# Patient Record
Sex: Female | Born: 1950 | Race: White | Hispanic: No | Marital: Married | Smoking: Never smoker
Health system: Southern US, Community
[De-identification: ages and names within clinical notes are randomized; demographics above are authoritative.]

## PROBLEM LIST (undated history)

## (undated) DIAGNOSIS — G4700 Insomnia, unspecified: Secondary | ICD-10-CM

## (undated) DIAGNOSIS — R002 Palpitations: Secondary | ICD-10-CM

## (undated) DIAGNOSIS — J45909 Unspecified asthma, uncomplicated: Secondary | ICD-10-CM

## (undated) DIAGNOSIS — M1990 Unspecified osteoarthritis, unspecified site: Secondary | ICD-10-CM

## (undated) DIAGNOSIS — I83893 Varicose veins of bilateral lower extremities with other complications: Secondary | ICD-10-CM

## (undated) DIAGNOSIS — E039 Hypothyroidism, unspecified: Secondary | ICD-10-CM

## (undated) HISTORY — DX: Insomnia, unspecified: G47.00

## (undated) HISTORY — DX: Varicose veins of bilateral lower extremities with other complications: I83.893

## (undated) HISTORY — PX: KNEE SURGERY: SHX244

## (undated) HISTORY — DX: Palpitations: R00.2

## (undated) HISTORY — PX: BREAST BIOPSY: SHX20

## (undated) HISTORY — DX: Hypothyroidism, unspecified: E03.9

## (undated) HISTORY — DX: Unspecified asthma, uncomplicated: J45.909

---

## 1995-05-05 DIAGNOSIS — E039 Hypothyroidism, unspecified: Secondary | ICD-10-CM

## 1995-05-05 HISTORY — DX: Hypothyroidism, unspecified: E03.9

## 2013-08-15 DIAGNOSIS — E039 Hypothyroidism, unspecified: Secondary | ICD-10-CM | POA: Diagnosis not present

## 2013-08-15 DIAGNOSIS — M7660 Achilles tendinitis, unspecified leg: Secondary | ICD-10-CM | POA: Diagnosis not present

## 2013-08-15 DIAGNOSIS — G4700 Insomnia, unspecified: Secondary | ICD-10-CM | POA: Diagnosis not present

## 2013-10-03 DIAGNOSIS — Z5181 Encounter for therapeutic drug level monitoring: Secondary | ICD-10-CM | POA: Diagnosis not present

## 2013-10-03 DIAGNOSIS — R030 Elevated blood-pressure reading, without diagnosis of hypertension: Secondary | ICD-10-CM | POA: Diagnosis not present

## 2013-10-03 DIAGNOSIS — E669 Obesity, unspecified: Secondary | ICD-10-CM | POA: Diagnosis not present

## 2013-10-03 DIAGNOSIS — Z0000 Encounter for general adult medical examination without abnormal findings: Secondary | ICD-10-CM | POA: Diagnosis not present

## 2013-10-03 DIAGNOSIS — Z136 Encounter for screening for cardiovascular disorders: Secondary | ICD-10-CM | POA: Diagnosis not present

## 2013-11-16 DIAGNOSIS — Z780 Asymptomatic menopausal state: Secondary | ICD-10-CM | POA: Diagnosis not present

## 2014-03-13 DIAGNOSIS — Z23 Encounter for immunization: Secondary | ICD-10-CM | POA: Diagnosis not present

## 2014-04-03 DIAGNOSIS — H538 Other visual disturbances: Secondary | ICD-10-CM | POA: Diagnosis not present

## 2014-04-03 DIAGNOSIS — H52223 Regular astigmatism, bilateral: Secondary | ICD-10-CM | POA: Diagnosis not present

## 2014-04-03 DIAGNOSIS — H2511 Age-related nuclear cataract, right eye: Secondary | ICD-10-CM | POA: Diagnosis not present

## 2014-04-03 DIAGNOSIS — H029 Unspecified disorder of eyelid: Secondary | ICD-10-CM | POA: Diagnosis not present

## 2014-04-04 ENCOUNTER — Other Ambulatory Visit: Payer: Self-pay

## 2014-04-04 DIAGNOSIS — E039 Hypothyroidism, unspecified: Secondary | ICD-10-CM | POA: Diagnosis not present

## 2014-04-04 DIAGNOSIS — Z1231 Encounter for screening mammogram for malignant neoplasm of breast: Secondary | ICD-10-CM

## 2014-04-12 DIAGNOSIS — H029 Unspecified disorder of eyelid: Secondary | ICD-10-CM | POA: Diagnosis not present

## 2014-04-12 DIAGNOSIS — H52223 Regular astigmatism, bilateral: Secondary | ICD-10-CM | POA: Diagnosis not present

## 2014-04-18 ENCOUNTER — Ambulatory Visit: Admission: RE | Admit: 2014-04-18 | Payer: Medicare Other | Source: Ambulatory Visit

## 2014-04-18 ENCOUNTER — Other Ambulatory Visit: Payer: Self-pay

## 2014-04-18 ENCOUNTER — Ambulatory Visit
Admission: RE | Admit: 2014-04-18 | Discharge: 2014-04-18 | Disposition: A | Discharge status: Home / Self Care | Payer: Medicare Other | Source: Ambulatory Visit

## 2014-04-18 DIAGNOSIS — Z1231 Encounter for screening mammogram for malignant neoplasm of breast: Secondary | ICD-10-CM | POA: Diagnosis not present

## 2014-05-15 DIAGNOSIS — E039 Hypothyroidism, unspecified: Secondary | ICD-10-CM | POA: Diagnosis not present

## 2014-07-10 DIAGNOSIS — R05 Cough: Secondary | ICD-10-CM | POA: Diagnosis not present

## 2014-10-08 DIAGNOSIS — E039 Hypothyroidism, unspecified: Secondary | ICD-10-CM | POA: Diagnosis not present

## 2014-10-08 DIAGNOSIS — Z0000 Encounter for general adult medical examination without abnormal findings: Secondary | ICD-10-CM | POA: Diagnosis not present

## 2014-10-08 DIAGNOSIS — E785 Hyperlipidemia, unspecified: Secondary | ICD-10-CM | POA: Diagnosis not present

## 2014-10-08 DIAGNOSIS — Z1211 Encounter for screening for malignant neoplasm of colon: Secondary | ICD-10-CM | POA: Diagnosis not present

## 2014-10-09 DIAGNOSIS — E039 Hypothyroidism, unspecified: Secondary | ICD-10-CM | POA: Diagnosis not present

## 2014-10-09 DIAGNOSIS — E785 Hyperlipidemia, unspecified: Secondary | ICD-10-CM | POA: Diagnosis not present

## 2014-10-16 DIAGNOSIS — Z1211 Encounter for screening for malignant neoplasm of colon: Secondary | ICD-10-CM | POA: Diagnosis not present

## 2014-12-18 DIAGNOSIS — R05 Cough: Secondary | ICD-10-CM | POA: Diagnosis not present

## 2015-01-11 DIAGNOSIS — M7051 Other bursitis of knee, right knee: Secondary | ICD-10-CM | POA: Diagnosis not present

## 2015-01-11 DIAGNOSIS — M25571 Pain in right ankle and joints of right foot: Secondary | ICD-10-CM | POA: Diagnosis not present

## 2015-01-11 DIAGNOSIS — M7052 Other bursitis of knee, left knee: Secondary | ICD-10-CM | POA: Diagnosis not present

## 2015-01-11 DIAGNOSIS — M25562 Pain in left knee: Secondary | ICD-10-CM | POA: Diagnosis not present

## 2015-01-11 DIAGNOSIS — M25561 Pain in right knee: Secondary | ICD-10-CM | POA: Diagnosis not present

## 2015-02-26 DIAGNOSIS — Z23 Encounter for immunization: Secondary | ICD-10-CM | POA: Diagnosis not present

## 2015-03-30 DIAGNOSIS — S93504A Unspecified sprain of right lesser toe(s), initial encounter: Secondary | ICD-10-CM | POA: Diagnosis not present

## 2015-04-18 DIAGNOSIS — B001 Herpesviral vesicular dermatitis: Secondary | ICD-10-CM | POA: Diagnosis not present

## 2015-05-01 ENCOUNTER — Other Ambulatory Visit: Payer: Self-pay

## 2015-05-01 DIAGNOSIS — Z1231 Encounter for screening mammogram for malignant neoplasm of breast: Secondary | ICD-10-CM

## 2015-05-07 ENCOUNTER — Ambulatory Visit: Payer: Self-pay

## 2015-05-10 DIAGNOSIS — J45901 Unspecified asthma with (acute) exacerbation: Secondary | ICD-10-CM | POA: Diagnosis not present

## 2015-05-21 DIAGNOSIS — M7051 Other bursitis of knee, right knee: Secondary | ICD-10-CM | POA: Diagnosis not present

## 2015-05-21 DIAGNOSIS — M7052 Other bursitis of knee, left knee: Secondary | ICD-10-CM | POA: Diagnosis not present

## 2015-05-27 DIAGNOSIS — M25562 Pain in left knee: Secondary | ICD-10-CM | POA: Diagnosis not present

## 2015-05-27 DIAGNOSIS — M25561 Pain in right knee: Secondary | ICD-10-CM | POA: Diagnosis not present

## 2015-05-30 DIAGNOSIS — M25562 Pain in left knee: Secondary | ICD-10-CM | POA: Diagnosis not present

## 2015-05-30 DIAGNOSIS — M25561 Pain in right knee: Secondary | ICD-10-CM | POA: Diagnosis not present

## 2015-06-04 DIAGNOSIS — M25562 Pain in left knee: Secondary | ICD-10-CM | POA: Diagnosis not present

## 2015-06-04 DIAGNOSIS — M25561 Pain in right knee: Secondary | ICD-10-CM | POA: Diagnosis not present

## 2015-06-06 DIAGNOSIS — M25561 Pain in right knee: Secondary | ICD-10-CM | POA: Diagnosis not present

## 2015-06-06 DIAGNOSIS — M25562 Pain in left knee: Secondary | ICD-10-CM | POA: Diagnosis not present

## 2015-06-11 DIAGNOSIS — M25562 Pain in left knee: Secondary | ICD-10-CM | POA: Diagnosis not present

## 2015-06-11 DIAGNOSIS — M25561 Pain in right knee: Secondary | ICD-10-CM | POA: Diagnosis not present

## 2015-06-13 DIAGNOSIS — M25562 Pain in left knee: Secondary | ICD-10-CM | POA: Diagnosis not present

## 2015-06-13 DIAGNOSIS — M25561 Pain in right knee: Secondary | ICD-10-CM | POA: Diagnosis not present

## 2015-06-18 DIAGNOSIS — M25562 Pain in left knee: Secondary | ICD-10-CM | POA: Diagnosis not present

## 2015-06-18 DIAGNOSIS — M25561 Pain in right knee: Secondary | ICD-10-CM | POA: Diagnosis not present

## 2015-06-19 DIAGNOSIS — R591 Generalized enlarged lymph nodes: Secondary | ICD-10-CM | POA: Diagnosis not present

## 2015-06-20 ENCOUNTER — Other Ambulatory Visit: Payer: Self-pay | Admitting: Family Medicine

## 2015-06-20 DIAGNOSIS — M25561 Pain in right knee: Secondary | ICD-10-CM | POA: Diagnosis not present

## 2015-06-20 DIAGNOSIS — R591 Generalized enlarged lymph nodes: Secondary | ICD-10-CM

## 2015-06-20 DIAGNOSIS — M25562 Pain in left knee: Secondary | ICD-10-CM | POA: Diagnosis not present

## 2015-06-24 ENCOUNTER — Other Ambulatory Visit: Payer: Self-pay

## 2015-06-25 DIAGNOSIS — M25561 Pain in right knee: Secondary | ICD-10-CM | POA: Diagnosis not present

## 2015-06-25 DIAGNOSIS — M25562 Pain in left knee: Secondary | ICD-10-CM | POA: Diagnosis not present

## 2015-06-26 ENCOUNTER — Ambulatory Visit
Admission: RE | Admit: 2015-06-26 | Discharge: 2015-06-26 | Disposition: A | Discharge status: Home / Self Care | Payer: Medicare Other | Source: Ambulatory Visit | Attending: Family Medicine | Admitting: Family Medicine

## 2015-06-26 DIAGNOSIS — R591 Generalized enlarged lymph nodes: Secondary | ICD-10-CM

## 2015-06-26 DIAGNOSIS — R221 Localized swelling, mass and lump, neck: Secondary | ICD-10-CM | POA: Diagnosis not present

## 2015-06-27 DIAGNOSIS — M25561 Pain in right knee: Secondary | ICD-10-CM | POA: Diagnosis not present

## 2015-06-27 DIAGNOSIS — M25562 Pain in left knee: Secondary | ICD-10-CM | POA: Diagnosis not present

## 2015-07-02 DIAGNOSIS — M25561 Pain in right knee: Secondary | ICD-10-CM | POA: Diagnosis not present

## 2015-07-02 DIAGNOSIS — M545 Low back pain: Secondary | ICD-10-CM | POA: Diagnosis not present

## 2015-07-08 DIAGNOSIS — M25562 Pain in left knee: Secondary | ICD-10-CM | POA: Diagnosis not present

## 2015-07-08 DIAGNOSIS — M25561 Pain in right knee: Secondary | ICD-10-CM | POA: Diagnosis not present

## 2015-07-11 DIAGNOSIS — M25562 Pain in left knee: Secondary | ICD-10-CM | POA: Diagnosis not present

## 2015-07-11 DIAGNOSIS — M25561 Pain in right knee: Secondary | ICD-10-CM | POA: Diagnosis not present

## 2015-07-15 DIAGNOSIS — M25562 Pain in left knee: Secondary | ICD-10-CM | POA: Diagnosis not present

## 2015-07-15 DIAGNOSIS — M25561 Pain in right knee: Secondary | ICD-10-CM | POA: Diagnosis not present

## 2015-07-18 DIAGNOSIS — M25561 Pain in right knee: Secondary | ICD-10-CM | POA: Diagnosis not present

## 2015-07-18 DIAGNOSIS — M25562 Pain in left knee: Secondary | ICD-10-CM | POA: Diagnosis not present

## 2015-08-13 DIAGNOSIS — M79671 Pain in right foot: Secondary | ICD-10-CM | POA: Diagnosis not present

## 2015-09-09 DIAGNOSIS — R05 Cough: Secondary | ICD-10-CM | POA: Diagnosis not present

## 2015-10-11 DIAGNOSIS — R05 Cough: Secondary | ICD-10-CM | POA: Diagnosis not present

## 2015-10-11 DIAGNOSIS — R591 Generalized enlarged lymph nodes: Secondary | ICD-10-CM | POA: Diagnosis not present

## 2015-10-11 DIAGNOSIS — E785 Hyperlipidemia, unspecified: Secondary | ICD-10-CM | POA: Diagnosis not present

## 2015-10-11 DIAGNOSIS — Z0000 Encounter for general adult medical examination without abnormal findings: Secondary | ICD-10-CM | POA: Diagnosis not present

## 2015-10-11 DIAGNOSIS — E039 Hypothyroidism, unspecified: Secondary | ICD-10-CM | POA: Diagnosis not present

## 2015-10-11 DIAGNOSIS — K219 Gastro-esophageal reflux disease without esophagitis: Secondary | ICD-10-CM | POA: Diagnosis not present

## 2015-10-11 DIAGNOSIS — Z1211 Encounter for screening for malignant neoplasm of colon: Secondary | ICD-10-CM | POA: Diagnosis not present

## 2015-10-14 ENCOUNTER — Ambulatory Visit
Admission: RE | Admit: 2015-10-14 | Discharge: 2015-10-14 | Disposition: A | Discharge status: Home / Self Care | Payer: Medicare Other | Source: Ambulatory Visit | Attending: Family Medicine | Admitting: Family Medicine

## 2015-10-14 ENCOUNTER — Other Ambulatory Visit: Payer: Self-pay | Admitting: Family Medicine

## 2015-10-14 DIAGNOSIS — R05 Cough: Secondary | ICD-10-CM | POA: Diagnosis not present

## 2015-10-14 DIAGNOSIS — R059 Cough, unspecified: Secondary | ICD-10-CM

## 2015-11-25 DIAGNOSIS — Z1211 Encounter for screening for malignant neoplasm of colon: Secondary | ICD-10-CM | POA: Diagnosis not present

## 2015-12-13 DIAGNOSIS — E039 Hypothyroidism, unspecified: Secondary | ICD-10-CM | POA: Diagnosis not present

## 2016-01-24 ENCOUNTER — Encounter: Payer: Self-pay | Admitting: Internal Medicine

## 2016-01-24 DIAGNOSIS — E039 Hypothyroidism, unspecified: Secondary | ICD-10-CM | POA: Diagnosis not present

## 2016-02-05 ENCOUNTER — Encounter (HOSPITAL_COMMUNITY): Payer: Self-pay | Admitting: *Deleted

## 2016-02-05 ENCOUNTER — Emergency Department (HOSPITAL_COMMUNITY): Payer: Medicare Other

## 2016-02-05 ENCOUNTER — Emergency Department (HOSPITAL_COMMUNITY)
Admission: EM | Admit: 2016-02-05 | Discharge: 2016-02-05 | Disposition: A | Discharge status: Home / Self Care | Payer: Medicare Other | Attending: Emergency Medicine | Admitting: Emergency Medicine

## 2016-02-05 DIAGNOSIS — R002 Palpitations: Secondary | ICD-10-CM | POA: Insufficient documentation

## 2016-02-05 HISTORY — DX: Unspecified osteoarthritis, unspecified site: M19.90

## 2016-02-05 LAB — CBC
HEMATOCRIT: 42 % (ref 36.0–46.0)
Hemoglobin: 13.4 g/dL (ref 12.0–15.0)
MCH: 28.7 pg (ref 26.0–34.0)
MCHC: 31.9 g/dL (ref 30.0–36.0)
MCV: 89.9 fL (ref 78.0–100.0)
PLATELETS: 256 10*3/uL (ref 150–400)
RBC: 4.67 MIL/uL (ref 3.87–5.11)
RDW: 14.6 % (ref 11.5–15.5)
WBC: 10.2 10*3/uL (ref 4.0–10.5)

## 2016-02-05 LAB — BASIC METABOLIC PANEL
ANION GAP: 10 (ref 5–15)
BUN: 12 mg/dL (ref 6–20)
CO2: 22 mmol/L (ref 22–32)
Calcium: 9.9 mg/dL (ref 8.9–10.3)
Chloride: 106 mmol/L (ref 101–111)
Creatinine, Ser: 0.87 mg/dL (ref 0.44–1.00)
GLUCOSE: 96 mg/dL (ref 65–99)
Potassium: 4.1 mmol/L (ref 3.5–5.1)
SODIUM: 138 mmol/L (ref 135–145)

## 2016-02-05 LAB — I-STAT TROPONIN, ED: Troponin i, poc: 0 ng/mL (ref 0.00–0.08)

## 2016-02-05 NOTE — ED Provider Notes (Signed)
[facility] DEPT Provider Note   CSN: [id number] Arrival date & time: 02/05/16  1737     History   Chief Complaint Chief Complaint  Patient presents with  . Palpitations    HPI Kendra Russell is a 65 y.o. female.  The history is provided by the patient. No language interpreter was used.  Palpitations      Kendra Russell is a 65 y.o. female who presents to the Emergency Department complaining of palpitations.  At 720 this morning she awoke with a quivering feeling in her left upper chest. Her symptoms lasted for less than a minute. She describes some associated soreness in the area. 2 months ago she had her Synthroid dose increased and since that time she's had episodes of intermittent diaphoresis and flushing. She did have some diaphoresis earlier today and she is unsure if it is related to the palpitations were due to her ongoing intermittent diaphoresis. She denies any associated fever, shortness of breath, chest pain, leg swelling or pain. She has a history of asthma and does get congestion and cough in her chest. This appears to be at its baseline. She has not had any recurrent palpitations, dizziness throughout the course of the day.  Past Medical History:  Diagnosis Date  . Arthritis   . Thyroid disease     There are no active problems to display for this patient.   Past Surgical History:  Procedure Laterality Date  . KNEE SURGERY      OB History    No data available       Home Medications    Prior to Admission medications   Not on File    Family History History reviewed. No pertinent family history.  Social History Social History  Substance Use Topics  . Smoking status: Never Smoker  . Smokeless tobacco: Never Used  . Alcohol use No     Allergies   Penicillins and Sulfa antibiotics   Review of Systems Review of Systems  Cardiovascular: Positive for palpitations.  All other systems reviewed and are negative.    Physical  Exam Updated Vital Signs BP 188/86   Pulse 91   Temp 98.3 F (36.8 C) (Oral)   Resp 18   Ht 5\' 5"  (1.651 m)   Wt 200 lb (90.7 kg)   SpO2 100%   BMI 33.28 kg/m   Physical Exam  Constitutional: She is oriented to person, place, and time. She appears well-developed and well-nourished.  HENT:  Head: Normocephalic and atraumatic.  Cardiovascular: Normal rate and regular rhythm.   No murmur heard. Pulmonary/Chest: Effort normal and breath sounds normal. No respiratory distress.  Abdominal: Soft. There is no tenderness. There is no rebound and no guarding.  Musculoskeletal: She exhibits no edema or tenderness.  Neurological: She is alert and oriented to person, place, and time.  Skin: Skin is warm and dry.  Psychiatric: She has a normal mood and affect. Her behavior is normal.  Nursing note and vitals reviewed.    ED Treatments / Results  Labs (all labs ordered are listed, but only abnormal results are displayed) Labs Reviewed  [place], ED    EKG  EKG Interpretation  Date/Time:  Wednesday February 05 2016 17:52:36 EDT Ventricular Rate:  90 PR Interval:  144 QRS Duration: 78 QT Interval:  348 QTC Calculation: 425 R Axis:   54 Text Interpretation:  Normal sinus rhythm Normal ECG Confirmed by Hazle Coca [phone]) on 02/05/2016 8:41:48 PM  Radiology Dg Chest 2 View  Result Date: 02/05/2016 CLINICAL DATA:  Heart palpitations starting this morning. EXAM: CHEST  2 VIEW COMPARISON:  10/14/2015 FINDINGS: The heart size there is top-normal and the mediastinal contours are within normal limits. No pneumonic consolidation, effusion or pneumothorax. Mild hyperinflation the upper lobes as before. No acute osseous abnormality. Small osteophytes are seen along the dorsal spine. IMPRESSION: No active cardiopulmonary disease. Mild hyperinflation of the lungs, upper lobe predominant. Electronically Signed   By: Ashley Royalty M.D.   On: 02/05/2016 21:12     Procedures Procedures (including critical care time)  Medications Ordered in ED Medications - No data to display   Initial Impression / Assessment and Plan / ED Course  I have reviewed the triage vital signs and the nursing notes.  Pertinent labs & imaging results that were available during my care of the patient were reviewed by me and considered in my medical decision making (see chart for details).  Clinical Course    Patient here for evaluation of palpitations. Her symptoms have resolved since 720 this morning. Presentation is not consistent with ACS, pneumonia, CHF. She is noted to be hypertensive in the emergency department but asymptomatic at this time. She has no known diagnosis of hypertension. Discussed with patient recording her blood pressures at home with PCP follow-up. Recommend cardiology follow-up for Holter monitoring for her palpitations. Discussed decreasing her caffeine use. Home care and return precautions discussed.  Final Clinical Impressions(s) / ED Diagnoses   Final diagnoses:  Palpitations    New Prescriptions New Prescriptions   No medications on file     Quintella Reichert, MD 02/05/16 2132

## 2016-02-05 NOTE — ED Triage Notes (Signed)
Pt reports "shaking" in her chest around 0720 this morning. Pt called dr around 1300, was told to come in for further eval. Pt denies having any other episodes since this morning. Pt denies shortness of breath, cp, n/v, denies lightheaded, dizziness.

## 2016-02-05 NOTE — Discharge Instructions (Signed)
Your blood pressure was elevated in the Emergency Department today.  Please check your blood pressure at home daily and record the number for your family doctor.

## 2016-02-06 ENCOUNTER — Other Ambulatory Visit (HOSPITAL_COMMUNITY): Payer: Self-pay | Admitting: *Deleted

## 2016-02-06 DIAGNOSIS — R002 Palpitations: Secondary | ICD-10-CM

## 2016-02-10 DIAGNOSIS — J4530 Mild persistent asthma, uncomplicated: Secondary | ICD-10-CM | POA: Diagnosis not present

## 2016-02-10 DIAGNOSIS — F5101 Primary insomnia: Secondary | ICD-10-CM | POA: Diagnosis not present

## 2016-02-10 DIAGNOSIS — R002 Palpitations: Secondary | ICD-10-CM | POA: Diagnosis not present

## 2016-02-19 ENCOUNTER — Ambulatory Visit (INDEPENDENT_AMBULATORY_CARE_PROVIDER_SITE_OTHER): Payer: Medicare Other

## 2016-02-19 DIAGNOSIS — R002 Palpitations: Secondary | ICD-10-CM

## 2016-03-11 ENCOUNTER — Institutional Professional Consult (permissible substitution): Payer: Self-pay | Admitting: Internal Medicine

## 2016-03-13 ENCOUNTER — Institutional Professional Consult (permissible substitution): Payer: Self-pay | Admitting: Emergency Medicine

## 2016-03-16 DIAGNOSIS — Z23 Encounter for immunization: Secondary | ICD-10-CM | POA: Diagnosis not present

## 2016-03-25 ENCOUNTER — Ambulatory Visit (INDEPENDENT_AMBULATORY_CARE_PROVIDER_SITE_OTHER): Payer: Medicare Other | Admitting: Internal Medicine

## 2016-03-25 ENCOUNTER — Encounter: Payer: Self-pay | Admitting: Internal Medicine

## 2016-03-25 VITALS — BP 124/74 | HR 89 | Ht 65.5 in | Wt 199.4 lb

## 2016-03-25 DIAGNOSIS — R079 Chest pain, unspecified: Secondary | ICD-10-CM | POA: Diagnosis not present

## 2016-03-25 DIAGNOSIS — R002 Palpitations: Secondary | ICD-10-CM

## 2016-03-25 DIAGNOSIS — R0602 Shortness of breath: Secondary | ICD-10-CM | POA: Diagnosis not present

## 2016-03-25 NOTE — Patient Instructions (Addendum)
Medication Instructions:  Your physician recommends that you continue on your current medications as directed. Please refer to the Current Medication list given to you today.   Labwork: None ordered   Testing/Procedures: Your physician has requested that you have an echocardiogram. Echocardiography is a painless test that uses sound waves to create images of your heart. It provides your doctor with information about the size and shape of your heart and how well your heart's chambers and valves are working. This procedure takes approximately one hour. There are no restrictions for this procedure.  Your physician has requested that you have a lexiscan myoview. For further information please visit [email or web address]. Please follow instruction sheet, as given.    Follow-Up:  Your physician recommends that you schedule a follow-up appointment in: 4 weeks with Dr Rayann Heman   Any Other Special Instructions Will Be Listed Below (If Applicable).     AliveCor Monitor

## 2016-03-25 NOTE — Progress Notes (Signed)
Electrophysiology Office Note   Date:  03/25/2016   ID:  Kendra Russell, DOB 1950-08-24, MRN [id number]  PCP:  Jonathon Bellows, MD   Primary Electrophysiologist: Thompson Grayer, MD    CC: palpitations   History of Present Illness: Kendra Russell is a 65 y.o. female who presents today for electrophysiology evaluation.   The patient has occasional palpitations.  Though these are typically known to last only seconds, she has had some palpitations which lasted longer.  She has a family history of atrial fibrillation.  She also has a husband who had a large stroke due to silent afib.  She therefore finds her palpitations quite alarming.  She recently presented to the ED and was without an arrhythmogenic cause.  She has worn a 30 day monitor which reveals sinus rhythm.  She did not have afib or SVT.  She describes a "quivering" feeling in her chest.  She has occasional chest discomfort and SOB with episodes. She recently has been seen by her PCP.  Labs were unrevealing.  She has limited caffeine without improvement.  She has difficulty sleeping.  Today, she denies symptoms of chest pain at rest,  orthopnea, PND, lower extremity edema, claudication, dizziness, presyncope, syncope, bleeding, or neurologic sequela. The patient is tolerating medications without difficulties and is otherwise without complaint today.    Past Medical History:  Diagnosis Date  . Arthritis   . Asthma   . Hypothyroidism 1997  . Insomnia   . Palpitations    Past Surgical History:  Procedure Laterality Date  . KNEE SURGERY       Current Outpatient Prescriptions  Medication Sig Dispense Refill  . albuterol (PROVENTIL HFA;VENTOLIN HFA) 108 (90 Base) MCG/ACT inhaler Inhale 2 puffs into the lungs every 4 (four) hours as needed for wheezing or shortness of breath.    . clindamycin (CLEOCIN) 150 MG capsule Take 150 mg by mouth once. TAKE 4 CAPSULE 30-90 MINUTES BEFORE PROCEDURE    . fluticasone (FLONASE) 50  MCG/ACT nasal spray Place 2 sprays into both nostrils daily.    . fluticasone-salmeterol (ADVAIR HFA) 115-21 MCG/ACT inhaler Inhale 2 puffs into the lungs 2 (two) times daily.    Marland Kitchen levothyroxine (SYNTHROID, LEVOTHROID) 125 MCG tablet Take 125 mcg by mouth daily before breakfast.    . Methylcellulose, Laxative, (CITRUCEL PO) Take by mouth daily.    . Multiple Minerals-Vitamins (CITRACAL PLUS PO) Take by mouth.    . Spacer/Aero Chamber Mouthpiece MISC by Does not apply route.    . valACYclovir (VALTREX) 1000 MG tablet 1,000 mg. TAKE TWO TABLETS DAILY EVERY 12 HRS AS NEEDED    . clonazePAM (KLONOPIN) 0.5 MG tablet Take 0.5 mg by mouth at bedtime as needed for anxiety.     No current facility-administered medications for this visit.     Allergies:   Other; Penicillins; Pepcid [famotidine]; and Sulfa antibiotics   Social History:  The patient  reports that she has never smoked. She has never used smokeless tobacco. She reports that she does not drink alcohol or use drugs.   Family History:  The patient's  family history includes Atrial fibrillation in her mother and sister.    ROS:  Please see the history of present illness.   All other systems are reviewed and negative.    PHYSICAL EXAM: VS:  BP 124/74   Pulse 89   Ht 5' 5.5" (1.664 m)   Wt 199 lb 6.4 oz (90.4 kg)   BMI 32.68 kg/m  , BMI Body  mass index is 32.68 kg/m. GEN: Well nourished, well developed, in no acute distress  HEENT: normal  Neck: no JVD, carotid bruits, or masses Cardiac: RRR; no murmurs, rubs, or gallops,no edema  Respiratory:  clear to auscultation bilaterally, normal work of breathing GI: soft, nontender, nondistended, + BS MS: no deformity or atrophy  Skin: warm and dry  Neuro:  Strength and sensation are intact Psych: euthymic mood, full affect  EKG:  EKG 02/06/16 is reviewed and reveals sinus rhythm, no arrhythmias  Recent Labs: 02/05/2016: BUN 12; Creatinine, Ser 0.87; Hemoglobin 13.4; Platelets 256;  Potassium 4.1; Sodium 138    Lipid Panel  No results found for: CHOL, TRIG, HDL, CHOLHDL, VLDL, LDLCALC, LDLDIRECT   Wt Readings from Last 3 Encounters:  03/25/16 199 lb 6.4 oz (90.4 kg)  02/05/16 200 lb (90.7 kg)      Other studies Reviewed: Additional studies/ records that were reviewed today include: ED notes, event monitor  Review of the above records today demonstrates: as above   ASSESSMENT AND PLAN:  1.  Palpitations Unclear etiology Likely due to PACs or PVCs though we have not previously captured these on monitoring.  She has FH of afib and worries about this as a possibility.  She did not have afib on recent 30 day monitoring. Today, we discussed options of conservative management vs implantation of a loop recorder vs AliveCor for further evaluation of her palpitations. At this time, she would prefer conservative management.  She will contemplate AliveCor (Kardia) vs implantable loop recorder.  She will return in 1 month for follow-up.   2. SOB/ chest discomfort during palpitations. Echo is ordered to evaluate for structural heart disease Will obtain lexiscan myoview (she has had prior knee surgery and does not feel that she could walk on a treadmill)   Follow-up:  Return in 1 month  Current medicines are reviewed at length with the patient today.   The patient does not have concerns regarding her medicines.  The following changes were made today:  none   Signed, Thompson Grayer, MD   [place] [address] [phone] (office) [phone] (fax)

## 2016-03-31 DIAGNOSIS — J301 Allergic rhinitis due to pollen: Secondary | ICD-10-CM | POA: Diagnosis not present

## 2016-03-31 DIAGNOSIS — J309 Allergic rhinitis, unspecified: Secondary | ICD-10-CM | POA: Diagnosis not present

## 2016-03-31 DIAGNOSIS — J4530 Mild persistent asthma, uncomplicated: Secondary | ICD-10-CM | POA: Diagnosis not present

## 2016-03-31 DIAGNOSIS — J3089 Other allergic rhinitis: Secondary | ICD-10-CM | POA: Diagnosis not present

## 2016-04-03 ENCOUNTER — Ambulatory Visit
Admission: RE | Admit: 2016-04-03 | Discharge: 2016-04-03 | Disposition: A | Discharge status: Home / Self Care | Payer: Medicare Other | Source: Ambulatory Visit

## 2016-04-03 DIAGNOSIS — Z1231 Encounter for screening mammogram for malignant neoplasm of breast: Secondary | ICD-10-CM | POA: Diagnosis not present

## 2016-04-03 DIAGNOSIS — J3089 Other allergic rhinitis: Secondary | ICD-10-CM | POA: Diagnosis not present

## 2016-04-03 DIAGNOSIS — J301 Allergic rhinitis due to pollen: Secondary | ICD-10-CM | POA: Diagnosis not present

## 2016-04-14 ENCOUNTER — Telehealth (HOSPITAL_COMMUNITY): Payer: Self-pay | Admitting: *Deleted

## 2016-04-14 NOTE — Telephone Encounter (Signed)
Patient given detailed instructions per Myocardial Perfusion Study Information Sheet for the test on 04/17/16 at 1130. Patient notified to arrive 15 minutes early and that it is imperative to arrive on time for appointment to keep from having the test rescheduled.  If you need to cancel or reschedule your appointment, please call the office within 24 hours of your appointment. Failure to do so may result in a cancellation of your appointment, and a $50 no show fee. Patient verbalized understanding. C.Hasspacher,RN

## 2016-04-14 NOTE — Telephone Encounter (Signed)
Left message on voicemail in reference to upcoming appointment scheduled for 04/17/16. Phone number given for a call back so details instructions can be given. Kendra Russell, Kendra Russell

## 2016-04-15 ENCOUNTER — Encounter: Payer: Self-pay | Admitting: Internal Medicine

## 2016-04-15 DIAGNOSIS — R05 Cough: Secondary | ICD-10-CM | POA: Diagnosis not present

## 2016-04-17 ENCOUNTER — Encounter (HOSPITAL_COMMUNITY): Payer: Self-pay

## 2016-04-20 ENCOUNTER — Other Ambulatory Visit: Payer: Self-pay

## 2016-04-20 ENCOUNTER — Ambulatory Visit (HOSPITAL_COMMUNITY): Payer: Medicare Other | Attending: Internal Medicine

## 2016-04-20 DIAGNOSIS — R002 Palpitations: Secondary | ICD-10-CM

## 2016-04-22 ENCOUNTER — Ambulatory Visit: Payer: Self-pay | Admitting: Internal Medicine

## 2016-04-23 ENCOUNTER — Telehealth (HOSPITAL_COMMUNITY): Payer: Self-pay | Admitting: *Deleted

## 2016-04-23 NOTE — Telephone Encounter (Signed)
Left message on voicemail in reference to upcoming appointment scheduled for 04/29/16. Phone number given for a call back so details instructions can be given. Kendra Russell

## 2016-04-29 ENCOUNTER — Ambulatory Visit (HOSPITAL_COMMUNITY): Payer: Medicare Other | Attending: Internal Medicine

## 2016-04-29 DIAGNOSIS — R079 Chest pain, unspecified: Secondary | ICD-10-CM | POA: Insufficient documentation

## 2016-04-29 LAB — MYOCARDIAL PERFUSION IMAGING
Peak HR: 104 {beats}/min
Rest HR: 65 {beats}/min

## 2016-04-29 MED ORDER — TECHNETIUM TC 99M TETROFOSMIN IV KIT
9.9000 | PACK | Freq: Once | INTRAVENOUS | Status: AC | PRN
Start: 2016-04-29 — End: 2016-04-29
  Administered 2016-04-29: 9.9 via INTRAVENOUS
  Filled 2016-04-29: qty 10

## 2016-04-29 MED ORDER — TECHNETIUM TC 99M TETROFOSMIN IV KIT
32.2000 | PACK | Freq: Once | INTRAVENOUS | Status: AC | PRN
  Administered 2016-04-29: 32.2 via INTRAVENOUS
  Filled 2016-04-29: qty 33

## 2016-05-08 ENCOUNTER — Encounter (INDEPENDENT_AMBULATORY_CARE_PROVIDER_SITE_OTHER): Payer: Self-pay

## 2016-05-08 ENCOUNTER — Ambulatory Visit (INDEPENDENT_AMBULATORY_CARE_PROVIDER_SITE_OTHER): Payer: Medicare Other | Admitting: Internal Medicine

## 2016-05-08 VITALS — BP 154/72 | HR 72 | Ht 65.5 in | Wt 200.0 lb

## 2016-05-08 DIAGNOSIS — R002 Palpitations: Secondary | ICD-10-CM

## 2016-05-08 NOTE — Patient Instructions (Signed)
Medication Instructions:  Your physician recommends that you continue on your current medications as directed. Please refer to the Current Medication list given to you today.   Labwork: None ordered   Testing/Procedures:   LINQ implant---05/12/16  Please arrive at [facility] at 6:30am   Follow-Up:   Your physician recommends that you schedule a follow-up appointment in: 10-14 days from 05/12/16 in device clinic for wound check and 2 months with Dr Rayann Heman   Any Other Special Instructions Will Be Listed Below (If Applicable).     If you need a refill on your cardiac medications before your next appointment, please call your pharmacy.

## 2016-05-08 NOTE — Progress Notes (Signed)
Electrophysiology Office Note   Date:  05/08/2016   ID:  Kendra Russell, DOB [birth year], MRN [id number]  PCP:  Jonathon Bellows, MD   Primary Electrophysiologist: Thompson Grayer, MD    CC: palpitations   History of Present Illness: Kendra Russell is a 66 y.o. female who presents today for follow up for palpitations.  She has had recurrent episodes since last office visit with the longest lasting 21 minutes. She has fatigue afterwards. She has also had problems with reflux and Dr Justin Mend has been adjusting her medications. She has not yet purchased her AliveCor monitor and asks again about ILR implant today.    Today, she denies symptoms of chest pain at rest,  orthopnea, PND, lower extremity edema, claudication, dizziness, presyncope, syncope, bleeding, or neurologic sequela. The patient is tolerating medications without difficulties and is otherwise without complaint today.    Past Medical History:  Diagnosis Date  . Arthritis   . Asthma   . Hypothyroidism 1997  . Insomnia   . Palpitations    Past Surgical History:  Procedure Laterality Date  . KNEE SURGERY       Current Outpatient Prescriptions  Medication Sig Dispense Refill  . albuterol (PROVENTIL HFA;VENTOLIN HFA) 108 (90 Base) MCG/ACT inhaler Inhale 2 puffs into the lungs every 4 (four) hours as needed for wheezing or shortness of breath.    Marland Kitchen BREO ELLIPTA 200-25 MCG/INH AEPB     . clindamycin (CLEOCIN) 150 MG capsule Take 150 mg by mouth once. TAKE 4 CAPSULE 30-90 MINUTES BEFORE PROCEDURE    . fluticasone-salmeterol (ADVAIR HFA) 115-21 MCG/ACT inhaler Inhale 2 puffs into the lungs 2 (two) times daily.    Marland Kitchen levothyroxine (SYNTHROID, LEVOTHROID) 125 MCG tablet Take 125 mcg by mouth daily before breakfast.    . Methylcellulose, Laxative, (CITRUCEL PO) Take by mouth daily.    . Multiple Minerals-Vitamins (CITRACAL PLUS PO) Take by mouth.    . valACYclovir (VALTREX) 1000 MG tablet 1,000 mg. TAKE TWO TABLETS DAILY EVERY  12 HRS AS NEEDED     No current facility-administered medications for this visit.     Allergies:   Other; Penicillins; Pepcid [famotidine]; and Sulfa antibiotics   Social History:  The patient  reports that she has never smoked. She has never used smokeless tobacco. She reports that she does not drink alcohol or use drugs.   Family History:  The patient's  family history includes Atrial fibrillation in her mother and sister.    ROS:  Please see the history of present illness.   All other systems are reviewed and negative.    PHYSICAL EXAM: VS:  BP (!) 154/72   Pulse 72   Ht 5' 5.5" (1.664 m)   Wt 200 lb (90.7 kg)   BMI 32.78 kg/m  , BMI Body mass index is 32.78 kg/m. GEN: Well nourished, well developed, in no acute distress  HEENT: normal  Neck: no JVD, carotid bruits, or masses Cardiac: RRR; no murmurs, rubs, or gallops,no edema  Respiratory:  clear to auscultation bilaterally, normal work of breathing GI: soft, nontender, nondistended, + BS MS: no deformity or atrophy  Skin: warm and dry  Neuro:  Strength and sensation are intact Psych: euthymic mood, full affect  EKG:  EKG 02/06/16 is reviewed and reveals sinus rhythm, no arrhythmias  Recent Labs: 02/05/2016: BUN 12; Creatinine, Ser 0.87; Hemoglobin 13.4; Platelets 256; Potassium 4.1; Sodium 138    Lipid Panel  No results found for: CHOL, TRIG, HDL, CHOLHDL, VLDL, LDLCALC, LDLDIRECT  Wt Readings from Last 3 Encounters:  05/08/16 200 lb (90.7 kg)  04/29/16 199 lb (90.3 kg)  03/25/16 199 lb 6.4 oz (90.4 kg)      Other studies Reviewed: Additional studies/ records that were reviewed today include: myoview and echo Review of the above records today demonstrates: as above   ASSESSMENT AND PLAN:  1.  Palpitations She continues to have symptomatic palpitations.  We have discussed monitoring options today.  She would prefer to proceed with implantable loop recorder.  Risks of procedure discussed with patient who  wishes to proceed.  Will schedule for next available time.   2. SOB/ chest discomfort during palpitations. Echo and myoview reassuring    Follow-up:  Return in 2 months  Current medicines are reviewed at length with the patient today.   The patient does not have concerns regarding her medicines.  The following changes were made today:  none   Signed, Thompson Grayer, MD   [place] [address] [phone] (office) [phone] (fax)

## 2016-05-12 ENCOUNTER — Ambulatory Visit (HOSPITAL_COMMUNITY)
Admission: RE | Admit: 2016-05-12 | Discharge: 2016-05-12 | Disposition: A | Discharge status: Home / Self Care | Payer: Medicare Other | Source: Ambulatory Visit | Attending: Internal Medicine | Admitting: Internal Medicine

## 2016-05-12 ENCOUNTER — Encounter (HOSPITAL_COMMUNITY): Payer: Self-pay | Admitting: Internal Medicine

## 2016-05-12 ENCOUNTER — Encounter (HOSPITAL_COMMUNITY)
Admission: RE | Disposition: A | Discharge status: Home / Self Care | Payer: Self-pay | Source: Ambulatory Visit | Attending: Internal Medicine

## 2016-05-12 DIAGNOSIS — Z8249 Family history of ischemic heart disease and other diseases of the circulatory system: Secondary | ICD-10-CM | POA: Insufficient documentation

## 2016-05-12 DIAGNOSIS — G4700 Insomnia, unspecified: Secondary | ICD-10-CM | POA: Diagnosis not present

## 2016-05-12 DIAGNOSIS — Z7951 Long term (current) use of inhaled steroids: Secondary | ICD-10-CM | POA: Insufficient documentation

## 2016-05-12 DIAGNOSIS — K219 Gastro-esophageal reflux disease without esophagitis: Secondary | ICD-10-CM | POA: Insufficient documentation

## 2016-05-12 DIAGNOSIS — R002 Palpitations: Secondary | ICD-10-CM | POA: Diagnosis not present

## 2016-05-12 DIAGNOSIS — E039 Hypothyroidism, unspecified: Secondary | ICD-10-CM | POA: Diagnosis not present

## 2016-05-12 DIAGNOSIS — J45909 Unspecified asthma, uncomplicated: Secondary | ICD-10-CM | POA: Insufficient documentation

## 2016-05-12 DIAGNOSIS — M1990 Unspecified osteoarthritis, unspecified site: Secondary | ICD-10-CM | POA: Insufficient documentation

## 2016-05-12 DIAGNOSIS — Z880 Allergy status to penicillin: Secondary | ICD-10-CM | POA: Diagnosis not present

## 2016-05-12 DIAGNOSIS — Z882 Allergy status to sulfonamides status: Secondary | ICD-10-CM | POA: Diagnosis not present

## 2016-05-12 HISTORY — PX: EP IMPLANTABLE DEVICE: SHX172B

## 2016-05-12 SURGERY — LOOP RECORDER INSERTION
Anesthesia: LOCAL

## 2016-05-12 MED ORDER — LIDOCAINE-EPINEPHRINE 1 %-1:100000 IJ SOLN
INTRAMUSCULAR | Status: AC
  Filled 2016-05-12: qty 1

## 2016-05-12 MED ORDER — LIDOCAINE-EPINEPHRINE 1 %-1:100000 IJ SOLN
INTRAMUSCULAR | Status: DC | PRN
  Administered 2016-05-12: 15 mL

## 2016-05-12 SURGICAL SUPPLY — 2 items
LOOP REVEAL LINQSYS (Prosthesis & Implant Heart) ×2 IMPLANT
PACK LOOP INSERTION (CUSTOM PROCEDURE TRAY) ×2 IMPLANT

## 2016-05-12 NOTE — H&P (View-Only) (Signed)
Electrophysiology Office Note   Date:  05/08/2016   ID:  Kendra Russell, DOB 10-Oct-1950, MRN [id number]  PCP:  Jonathon Bellows, MD   Primary Electrophysiologist: Thompson Grayer, MD    CC: palpitations   History of Present Illness: Kendra Russell is a 66 y.o. female who presents today for follow up for palpitations.  She has had recurrent episodes since last office visit with the longest lasting 21 minutes. She has fatigue afterwards. She has also had problems with reflux and Dr Justin Mend has been adjusting her medications. She has not yet purchased her AliveCor monitor and asks again about ILR implant today.    Today, she denies symptoms of chest pain at rest,  orthopnea, PND, lower extremity edema, claudication, dizziness, presyncope, syncope, bleeding, or neurologic sequela. The patient is tolerating medications without difficulties and is otherwise without complaint today.    Past Medical History:  Diagnosis Date  . Arthritis   . Asthma   . Hypothyroidism 1997  . Insomnia   . Palpitations    Past Surgical History:  Procedure Laterality Date  . KNEE SURGERY       Current Outpatient Prescriptions  Medication Sig Dispense Refill  . albuterol (PROVENTIL HFA;VENTOLIN HFA) 108 (90 Base) MCG/ACT inhaler Inhale 2 puffs into the lungs every 4 (four) hours as needed for wheezing or shortness of breath.    Marland Kitchen BREO ELLIPTA 200-25 MCG/INH AEPB     . clindamycin (CLEOCIN) 150 MG capsule Take 150 mg by mouth once. TAKE 4 CAPSULE 30-90 MINUTES BEFORE PROCEDURE    . fluticasone-salmeterol (ADVAIR HFA) 115-21 MCG/ACT inhaler Inhale 2 puffs into the lungs 2 (two) times daily.    Marland Kitchen levothyroxine (SYNTHROID, LEVOTHROID) 125 MCG tablet Take 125 mcg by mouth daily before breakfast.    . Methylcellulose, Laxative, (CITRUCEL PO) Take by mouth daily.    . Multiple Minerals-Vitamins (CITRACAL PLUS PO) Take by mouth.    . valACYclovir (VALTREX) 1000 MG tablet 1,000 mg. TAKE TWO TABLETS DAILY EVERY  12 HRS AS NEEDED     No current facility-administered medications for this visit.     Allergies:   Other; Penicillins; Pepcid [famotidine]; and Sulfa antibiotics   Social History:  The patient  reports that she has never smoked. She has never used smokeless tobacco. She reports that she does not drink alcohol or use drugs.   Family History:  The patient's  family history includes Atrial fibrillation in her mother and sister.    ROS:  Please see the history of present illness.   All other systems are reviewed and negative.    PHYSICAL EXAM: VS:  BP (!) 154/72   Pulse 72   Ht 5' 5.5" (1.664 m)   Wt 200 lb (90.7 kg)   BMI 32.78 kg/m  , BMI Body mass index is 32.78 kg/m. GEN: Well nourished, well developed, in no acute distress  HEENT: normal  Neck: no JVD, carotid bruits, or masses Cardiac: RRR; no murmurs, rubs, or gallops,no edema  Respiratory:  clear to auscultation bilaterally, normal work of breathing GI: soft, nontender, nondistended, + BS MS: no deformity or atrophy  Skin: warm and dry  Neuro:  Strength and sensation are intact Psych: euthymic mood, full affect  EKG:  EKG 02/06/16 is reviewed and reveals sinus rhythm, no arrhythmias  Recent Labs: 02/05/2016: BUN 12; Creatinine, Ser 0.87; Hemoglobin 13.4; Platelets 256; Potassium 4.1; Sodium 138    Lipid Panel  No results found for: CHOL, TRIG, HDL, CHOLHDL, VLDL, LDLCALC, LDLDIRECT  Wt Readings from Last 3 Encounters:  05/08/16 200 lb (90.7 kg)  04/29/16 199 lb (90.3 kg)  03/25/16 199 lb 6.4 oz (90.4 kg)      Other studies Reviewed: Additional studies/ records that were reviewed today include: myoview and echo Review of the above records today demonstrates: as above   ASSESSMENT AND PLAN:  1.  Palpitations She continues to have symptomatic palpitations.  We have discussed monitoring options today.  She would prefer to proceed with implantable loop recorder.  Risks of procedure discussed with patient who  wishes to proceed.  Will schedule for next available time.   2. SOB/ chest discomfort during palpitations. Echo and myoview reassuring    Follow-up:  Return in 2 months  Current medicines are reviewed at length with the patient today.   The patient does not have concerns regarding her medicines.  The following changes were made today:  none   Signed, Thompson Grayer, MD   [place] [address] [phone] (office) [phone] (fax)

## 2016-05-12 NOTE — Interval H&P Note (Signed)
History and Physical Interval Note:  05/12/2016 7:46 AM  Kendra Russell  has presented today for surgery, with the diagnosis of palpitations  The various methods of treatment have been discussed with the patient and family. After consideration of risks, benefits and other options for treatment, the patient has consented to  Procedure(s): Loop Recorder Insertion (N/A) as a surgical intervention .  The patient's history has been reviewed, patient examined, no change in status, stable for surgery.  I have reviewed the patient's chart and labs.  Questions were answered to the patient's satisfaction.     Thompson Grayer

## 2016-05-25 ENCOUNTER — Ambulatory Visit (INDEPENDENT_AMBULATORY_CARE_PROVIDER_SITE_OTHER): Payer: Medicare Other | Admitting: *Deleted

## 2016-05-25 DIAGNOSIS — R002 Palpitations: Secondary | ICD-10-CM

## 2016-05-25 LAB — CUP PACEART INCLINIC DEVICE CHECK
Implantable Pulse Generator Implant Date: 20180109
MDC IDC SESS DTM: 20180122145902

## 2016-05-25 NOTE — Progress Notes (Signed)
Loop wound check in clinic. Steri-strips removed, incision well healed. No redness, swelling, drainage. Battery status: good. R-waves 0.21mV. No episodes. Patient educated on Carelink monitoring, symptom activator and wound care. Monthly summary reports and ROV with JA 07/20/16.

## 2016-05-29 ENCOUNTER — Other Ambulatory Visit: Payer: Self-pay | Admitting: Internal Medicine

## 2016-06-05 ENCOUNTER — Telehealth: Payer: Self-pay | Admitting: *Deleted

## 2016-06-05 NOTE — Telephone Encounter (Signed)
Spoke with patient regarding tachy episodes from 06/01/16 around 1500.  Transmission was received today, 06/05/16.  Episode ECGs reviewed with Dr. Lovena Le, who advised that they show probable ST (patient active at time of episode per device).  Patient states she cannot recall any symptoms during this time.  Episodes placed in Dr. Otilio Connors folder for review.   Patient is very concerned that her monitor is not transmitting automatically each night.  Provided extensive education to patient, who has been unplugging monitor and moving it around her house nearly nightly as she sleeps poorly and does not spend the full night in her bedroom.  Advised that every time she interrupts the automatic transmission (between midnight and 5am) by unplugging the monitor or leaving the room and not returning, the monitor cannot transmit.  It alerts [place] after 14 nights of being unable to transmit, but does not alert nightly.  Offered to reprogram device to transmit at another time of day, but patient states she's "all over the place and can't be by the monitor" reliably at any time during the day.  After much discussion, determined that we will trial leaving the monitor in place in either her bedroom or the guest room.  Monitor will not transmit nightly as patient leaves the room often, but will hopefully transmit automatically a few times a week.  Patient will send a manual transmission once weekly on Thursdays to ensure monitor does not become disconnected.  Patient encouraged to keep a log of dates/times/symptoms with any palpitation episodes.  Encouraged patient to call the [facility] if she experiences any episodes and uses her symptom activator.  I will plan to check to see if monitor is updating automatically during the week.  Patient is agreeable to me calling her next Thursday with the results from the week to determine if manual transmissions will be necessary in the future.  She is appreciative of assistance.

## 2016-06-11 ENCOUNTER — Ambulatory Visit (INDEPENDENT_AMBULATORY_CARE_PROVIDER_SITE_OTHER): Payer: Medicare Other | Admitting: *Deleted

## 2016-06-11 DIAGNOSIS — R002 Palpitations: Secondary | ICD-10-CM

## 2016-06-12 NOTE — Telephone Encounter (Signed)
[facility] requesting call back.  Carelink monitor is not updating automatically so will plan to have patient send a manual transmission weekly and PRN for palpitations.

## 2016-06-12 NOTE — Progress Notes (Signed)
Carelink Summary Report / Loop Recorder 

## 2016-06-16 NOTE — Telephone Encounter (Signed)
Spoke with patient.  Advised that monitor did not update automatically last week until patient sent a manual transmission on Thursday.  It did update early this morning, however.  Advised patient to continue sending manual transmissions once weekly and to use her symptom activator, send a manual transmission, and call the [facility] number if she has an episode of palpitations in the future.  Patient is agreeable to this plan.  Patient is concerned that she may need a new Carelink monitor because it does not transmit nightly.  Advised that a new monitor will not prevent the transmissions from becoming interrupted if she leaves the room or unplugs and moves the monitor.  Patient is agreeable to keeping her same monitor for the time being and will follow the manual transmission plan detailed above.

## 2016-06-23 ENCOUNTER — Telehealth: Payer: Self-pay | Admitting: Cardiology

## 2016-06-23 NOTE — Telephone Encounter (Signed)
Pt called and stated that she was cooking and she started feeling hot and her heart started beating really fast. She placed a cold rag on her head and continued to have a high heart rate.She used her symptom activator. Pt upset b/c she doesn't understand why her monitor doesn't updated nightly. I explained to pt that it can be a number of reasons, and that tech support would be the better place to get the answer to that question b/c they are able to see things that we are unable to see. Pt believes she needs a new monitor. She is going to call tech support. She is sending remote transmission now and is aware that a device tech RN will call back with results.

## 2016-06-23 NOTE — Telephone Encounter (Signed)
Transmission received. Tachy episode Sunday at 1652 + 2 symptom episodes correlating. She felt hot on neck and face- skin turns red. She feels like she should stop and cool down. She is very anxious and worried about a potential stroke. Kendra Russell will call [employer] for monitor troubleshooting. Episode appears to be 1:1 SVT @ 188bpm- will review episode with JA first thing tomorrow and call Mrs. Holzworth back. She verbalizes understanding.

## 2016-06-24 NOTE — Telephone Encounter (Signed)
Follow Up    Pt returning phone call. States she would like to be called after 10:30

## 2016-06-24 NOTE — Telephone Encounter (Signed)
I explained to Kendra Russell that Dr. Rayann Heman reviewed the episode and gave a verbal order for Cardizem 30mg  PO Q6 hours PRN and that she can see [name], NP this week.  She is very concerned about the risk for stroke- I have assured her that this is not atrial fibrillation that has been detected on ILR. She verbalizes understanding. She would like to wait to see EP NP before being prescribed PRN Cardizem. I scheduled her for 0800 06/26/16 with Amber.

## 2016-06-25 NOTE — Progress Notes (Signed)
Electrophysiology Office Note Date: 06/26/2016  ID:  Kendra Russell, DOB [birth year], MRN [id number]  PCP: Jonathon Bellows, MD Electrophysiologist: Allred  CC: SVT on ILR  Kendra Russell is a 66 y.o. female seen today for Dr Rayann Heman.  She recently underwent ILR implant for evaluation of palpitations and has been found to have SVT which correlates to symptoms.  She presents today to further discuss.   Since last being seen in our clinic, the patient reports doing reasonably well.  She denies chest pain, dyspnea, PND, orthopnea, nausea, vomiting, dizziness, syncope, edema, weight gain, or early satiety.  Past Medical History:  Diagnosis Date  . Arthritis   . Asthma   . Hypothyroidism 1997  . Insomnia   . Palpitations    Past Surgical History:  Procedure Laterality Date  . EP IMPLANTABLE DEVICE N/A 05/12/2016   Procedure: Loop Recorder Insertion;  Surgeon: Thompson Grayer, MD;  Location: [facility];  Service: Cardiovascular;  Laterality: N/A;  . KNEE SURGERY      Current Outpatient Prescriptions  Medication Sig Dispense Refill  . albuterol (PROVENTIL HFA;VENTOLIN HFA) 108 (90 Base) MCG/ACT inhaler Inhale 2 puffs into the lungs every 4 (four) hours as needed for wheezing or shortness of breath.    Marland Kitchen BREO ELLIPTA 200-25 MCG/INH AEPB Inhale 1 puff into the lungs daily.     . clindamycin (CLEOCIN) 150 MG capsule Take 150 mg by mouth once. TAKE 4 CAPSULE 30-90 MINUTES BEFORE ANY PROCEDURE (Dental, Surgeries, etc...)    . diclofenac sodium (VOLTAREN) 1 % GEL Apply 1 application topically daily as needed (knee pain).    . fluticasone (FLONASE) 50 MCG/ACT nasal spray Place 2 sprays into both nostrils daily.    . Hypertonic Nasal Wash (SINUS RINSE NA) Place 1 applicator into the nose every other day.    . levothyroxine (SYNTHROID, LEVOTHROID) 125 MCG tablet Take 125 mcg by mouth daily before breakfast.    . Melatonin 5 MG TABS Take 1 tablet by mouth at bedtime.    .  Methylcellulose, Laxative, (CITRUCEL) 500 MG TABS Take 1 tablet by mouth at bedtime.    . montelukast (SINGULAIR) 10 MG tablet Take 1 tablet by mouth at bedtime.    . Multiple Minerals-Vitamins (CALCIUM CITRATE-MAG-MINERALS) TABS Take 1 tablet by mouth daily.    . mupirocin ointment (BACTROBAN) 2 % Place 1 application into the nose 2 (two) times daily as needed.    . vitamin C (ASCORBIC ACID) 500 MG tablet Take 500 mg by mouth daily.    Marland Kitchen diltiazem (CARDIZEM) 30 MG tablet Take 1 tablet (30 mg total) by mouth daily as needed (palpitations). 30 tablet 6   No current facility-administered medications for this visit.     Allergies:   Other; Penicillins; Pepcid [famotidine]; and Sulfa antibiotics   Social History: Social History   Social History  . Marital status: Unknown    Spouse name: N/A  . Number of children: N/A  . Years of education: N/A   Occupational History  . Not on file.   Social History Main Topics  . Smoking status: Never Smoker  . Smokeless tobacco: Never Used  . Alcohol use No  . Drug use: No  . Sexual activity: Not on file   Other Topics Concern  . Not on file   Social History Narrative   Pt lives in [place] with spouse.   Retired.  Worked in eye care (clerical)    Family History: Family History  Problem Relation  Age of Onset  . Atrial fibrillation Mother   . Atrial fibrillation Sister     Review of Systems: All other systems reviewed and are otherwise negative except as noted above.   Physical Exam: VS:  BP 134/68   Pulse 77   Ht 5' 5.5" (1.664 m)   Wt 202 lb 3.2 oz (91.7 kg)   SpO2 99%   BMI 33.14 kg/m  , BMI Body mass index is 33.14 kg/m. Wt Readings from Last 3 Encounters:  06/26/16 202 lb 3.2 oz (91.7 kg)  05/12/16 200 lb (90.7 kg)  05/08/16 200 lb (90.7 kg)    GEN- The patient is well appearing, alert and oriented x 3 today.   HEENT: normocephalic, atraumatic; sclera clear, conjunctiva pink; hearing intact; oropharynx clear; neck  supple  Lungs- Clear to ausculation bilaterally, normal work of breathing.  No wheezes, rales, rhonchi Heart- Regular rate and rhythm  GI- soft, non-tender, non-distended, bowel sounds present  Extremities- no clubbing, cyanosis, or edema  MS- no significant deformity or atrophy Skin- warm and dry, no rash or lesion  Psych- euthymic mood, full affect Neuro- strength and sensation are intact   EKG:  EKG is not ordered today.  Recent Labs: 02/05/2016: BUN 12; Creatinine, Ser 0.87; Hemoglobin 13.4; Platelets 256; Potassium 4.1; Sodium 138    Other studies Reviewed: Additional studies/ records that were reviewed today include: Dr Jackalyn Lombard office notes  Assessment and Plan: 1.  SVT Corresponds with symptoms of palpitations She does not have documented atrial fibrillation Will give Cardizem 30mg  to take prn for sustained palpitations Pt reassured today Continue to monitor remotely  She also has symptoms of flushing that are not related to any arrhythmia - reviewed today in detail.   Current medicines are reviewed at length with the patient today.   The patient does not have concerns regarding her medicines.  The following changes were made today:  Start Cardizem 30mg  daily   Labs/ tests ordered today include: none No orders of the defined types were placed in this encounter.    Disposition:   Follow up with Dr Rayann Heman as scheduled    Signed, [name], NP 06/26/2016 8:59 AM   [place] [id number] [phone] (office) [phone] (fax

## 2016-06-26 ENCOUNTER — Ambulatory Visit (INDEPENDENT_AMBULATORY_CARE_PROVIDER_SITE_OTHER): Payer: Medicare Other | Admitting: Nurse Practitioner

## 2016-06-26 ENCOUNTER — Encounter (INDEPENDENT_AMBULATORY_CARE_PROVIDER_SITE_OTHER): Payer: Self-pay

## 2016-06-26 ENCOUNTER — Encounter: Payer: Self-pay | Admitting: Nurse Practitioner

## 2016-06-26 VITALS — BP 134/68 | HR 77 | Ht 65.5 in | Wt 202.2 lb

## 2016-06-26 DIAGNOSIS — I471 Supraventricular tachycardia: Secondary | ICD-10-CM | POA: Diagnosis not present

## 2016-06-26 MED ORDER — DILTIAZEM HCL 30 MG PO TABS: 30.0000 mg | ORAL_TABLET | Freq: Every day | ORAL | 6 refills | Status: DC | PRN

## 2016-06-26 NOTE — Patient Instructions (Addendum)
Medication Instructions:   START TAKING CARDIZEM 30 MG  AS NEEDED   If you need a refill on your cardiac medications before your next appointment, please call your pharmacy.  Labwork: NONE ORDERED  TODAY    Testing/Procedures: NONE ORDERED  TODAY   Follow-Up:  ALLRED IN 6  MONTHS    Any Other Special Instructions Will Be Listed Below (If Applicable).

## 2016-07-05 LAB — CUP PACEART REMOTE DEVICE CHECK
Date Time Interrogation Session: 20180208123606
MDC IDC PG IMPLANT DT: 20180109

## 2016-07-05 NOTE — Progress Notes (Signed)
Carelink summary report received. Battery status OK. Normal device function. No brady, or pause episodes. No new AF episodes. 1 symptom- ECG appears SR w/ PVCs/PACs. 2 tachy- appear atrial in origin. Monthly summary reports and ROV/PRN

## 2016-07-06 DIAGNOSIS — R05 Cough: Secondary | ICD-10-CM | POA: Diagnosis not present

## 2016-07-06 DIAGNOSIS — R002 Palpitations: Secondary | ICD-10-CM | POA: Diagnosis not present

## 2016-07-07 DIAGNOSIS — M7062 Trochanteric bursitis, left hip: Secondary | ICD-10-CM | POA: Diagnosis not present

## 2016-07-13 ENCOUNTER — Ambulatory Visit (INDEPENDENT_AMBULATORY_CARE_PROVIDER_SITE_OTHER): Payer: Medicare Other | Admitting: *Deleted

## 2016-07-13 DIAGNOSIS — R002 Palpitations: Secondary | ICD-10-CM

## 2016-07-14 NOTE — Progress Notes (Signed)
Carelink Summary Report / Loop Recorder 

## 2016-07-16 ENCOUNTER — Other Ambulatory Visit: Payer: Self-pay | Admitting: Internal Medicine

## 2016-07-18 LAB — CUP PACEART REMOTE DEVICE CHECK
Implantable Pulse Generator Implant Date: 20180109
MDC IDC SESS DTM: 20180310123706

## 2016-07-18 NOTE — Progress Notes (Signed)
Carelink summary report received. Battery status OK. Normal device function. No brady or pause episodes. No new AF episodes. 4 symptom- 2 appear SR w/ PVCs, 2 appear SR, then SVT, 1 tachy that correlates with SVT also. Pt seen in clinic to discuss SVT and medication adjustments with addition of cardizem. Monthly summary reports and ROV/PRN

## 2016-07-20 ENCOUNTER — Encounter: Payer: Self-pay | Admitting: Internal Medicine

## 2016-07-20 ENCOUNTER — Ambulatory Visit (INDEPENDENT_AMBULATORY_CARE_PROVIDER_SITE_OTHER): Payer: Medicare Other | Admitting: Internal Medicine

## 2016-07-20 VITALS — BP 148/82 | HR 78 | Ht 65.5 in | Wt 202.0 lb

## 2016-07-20 DIAGNOSIS — I10 Essential (primary) hypertension: Secondary | ICD-10-CM | POA: Diagnosis not present

## 2016-07-20 DIAGNOSIS — I471 Supraventricular tachycardia: Secondary | ICD-10-CM | POA: Diagnosis not present

## 2016-07-20 NOTE — Progress Notes (Signed)
Electrophysiology Office Note   Date:  07/20/2016   ID:  Kendra Russell, DOB August 27, 1950, MRN [id number]  PCP:  Jonathon Bellows, MD   Primary Electrophysiologist: Thompson Grayer, MD    CC: palpitations   History of Present Illness: Kendra Russell is a 66 y.o. female who presents today for follow up for SVT.  Her recent ILR placement has revealed regular episodes of SVT.  Episodes typically last several minutes and then resolve.  Her most recent episode was 07/07/16.  She has not taken her prn cardizem.  Today, she denies symptoms of chest pain at rest, orthopnea, PND, lower extremity edema, claudication, dizziness, presyncope, syncope, bleeding, or neurologic sequela. The patient is tolerating medications without difficulties and is otherwise without complaint today.    Past Medical History:  Diagnosis Date  . Arthritis   . Asthma   . Hypothyroidism 1997  . Insomnia   . Palpitations    Past Surgical History:  Procedure Laterality Date  . EP IMPLANTABLE DEVICE N/A 05/12/2016   Procedure: Loop Recorder Insertion;  Surgeon: Thompson Grayer, MD;  Location: [facility];  Service: Cardiovascular;  Laterality: N/A;  . KNEE SURGERY       Current Outpatient Prescriptions  Medication Sig Dispense Refill  . albuterol (PROVENTIL HFA;VENTOLIN HFA) 108 (90 Base) MCG/ACT inhaler Inhale 2 puffs into the lungs every 4 (four) hours as needed for wheezing or shortness of breath.    Marland Kitchen BREO ELLIPTA 200-25 MCG/INH AEPB Inhale 1 puff into the lungs daily.     . clindamycin (CLEOCIN) 150 MG capsule Take 150 mg by mouth once. TAKE 4 CAPSULE 30-90 MINUTES BEFORE ANY PROCEDURE (Dental, Surgeries, etc...)    . diclofenac sodium (VOLTAREN) 1 % GEL Apply 1 application topically daily as needed (knee pain).    Marland Kitchen diltiazem (CARDIZEM) 30 MG tablet Take 1 tablet (30 mg total) by mouth daily as needed (palpitations). 30 tablet 6  . fluticasone (FLONASE) 50 MCG/ACT nasal spray Place 2 sprays into both  nostrils daily.    . Hypertonic Nasal Wash (SINUS RINSE NA) Place 1 applicator into the nose every other day.    . levothyroxine (SYNTHROID, LEVOTHROID) 125 MCG tablet Take 125 mcg by mouth daily before breakfast.    . Melatonin 5 MG TABS Take 1 tablet by mouth at bedtime.    . Methylcellulose, Laxative, (CITRUCEL) 500 MG TABS Take 1 tablet by mouth at bedtime.    . Multiple Minerals-Vitamins (CALCIUM CITRATE-MAG-MINERALS) TABS Take 1 tablet by mouth as directed.     . mupirocin ointment (BACTROBAN) 2 % Place 1 application into the nose 2 (two) times daily as needed (Use as directed).     . vitamin C (ASCORBIC ACID) 500 MG tablet Take 500 mg by mouth as directed.      No current facility-administered medications for this visit.     Allergies:   Other; Penicillins; Pepcid [famotidine]; and Sulfa antibiotics   Social History:  The patient  reports that she has never smoked. She has never used smokeless tobacco. She reports that she does not drink alcohol or use drugs.   Family History:  The patient's  family history includes Atrial fibrillation in her mother and sister.    ROS:  Please see the history of present illness.   All other systems are reviewed and negative.    PHYSICAL EXAM: VS:  BP (!) 148/82   Pulse 78   Ht 5' 5.5" (1.664 m)   Wt 202 lb (91.6 kg)  SpO2 99%   BMI 33.10 kg/m  , BMI Body mass index is 33.1 kg/m. GEN: Well nourished, well developed, in no acute distress  HEENT: normal  Neck: no JVD  Cardiac: RRR; no murmurs, rubs, or gallops,no edema  Respiratory:  clear to auscultation bilaterally, normal work of breathing GI: soft, nontender, nondistended, + BS MS: no deformity or atrophy  Skin: warm and dry  Neuro:  Strength and sensation are intact Psych: euthymic mood, full affect    Recent Labs: 02/05/2016: BUN 12; Creatinine, Ser 0.87; Hemoglobin 13.4; Platelets 256; Potassium 4.1; Sodium 138   Wt Readings from Last 3 Encounters:  07/20/16 202 lb (91.6 kg)    06/26/16 202 lb 3.2 oz (91.7 kg)  05/12/16 200 lb (90.7 kg)      ASSESSMENT AND PLAN:  1.  SVT She continues to have symptomatic SVT as documented on her ILR. Therapeutic strategies for supraventricular tachycardia including medicine and ablation were discussed in detail with the patient today. Risk, benefits, and alternatives to EP study and radiofrequency ablation were also discussed in detail today.  At this time, she is clear that she would prefer a conservative approach.  I have therefore made no changes today.  She will return in 6 months for further discussions.  2. HTN Elevated BP today I have encouraged lifestyle modification I have also encouraged her to have her blood pressure monitored at home.  Current medicines are reviewed at length with the patient today.   The patient does not have concerns regarding her medicines.  The following changes were made today:  none   Signed, Thompson Grayer, MD   [place] [address] [phone] (office) [phone] (fax)

## 2016-07-20 NOTE — Patient Instructions (Signed)

## 2016-07-22 ENCOUNTER — Other Ambulatory Visit: Payer: Self-pay | Admitting: Internal Medicine

## 2016-08-10 ENCOUNTER — Ambulatory Visit (INDEPENDENT_AMBULATORY_CARE_PROVIDER_SITE_OTHER): Payer: Medicare Other | Admitting: *Deleted

## 2016-08-10 DIAGNOSIS — R002 Palpitations: Secondary | ICD-10-CM | POA: Diagnosis not present

## 2016-08-11 NOTE — Progress Notes (Signed)
Carelink Summary Report / Loop Recorder 

## 2016-08-13 DIAGNOSIS — L989 Disorder of the skin and subcutaneous tissue, unspecified: Secondary | ICD-10-CM | POA: Diagnosis not present

## 2016-08-18 DIAGNOSIS — L0100 Impetigo, unspecified: Secondary | ICD-10-CM | POA: Diagnosis not present

## 2016-08-18 LAB — CUP PACEART REMOTE DEVICE CHECK
Date Time Interrogation Session: 20180409133741
MDC IDC PG IMPLANT DT: 20180109

## 2016-08-18 NOTE — Progress Notes (Signed)
Carelink summary report received. Battery status OK. Normal device function. No new symptom episodes, brady, or pause episodes. No new AF episodes. 9 tachy- ECGs appear SVT w. occ. PVC (known hx, has PRN cardizem for episodes) pt has chosen conservative measures per EPIC note. Monthly summary reports and ROV/PRN

## 2016-09-03 ENCOUNTER — Telehealth: Payer: Self-pay | Admitting: Cardiology

## 2016-09-03 NOTE — Telephone Encounter (Signed)
Pt called and stated that she has had some episodes, Friday (August 28, 2016) evening while cooking dinner she had a fast heart beat. She used her symptom activator for this episode. She woke uptwo morning with what felt like her heart was quivering. No shortness of breath, no dizziness, she did used her symptom activator for one episode. Call forwarded to device tech RN.

## 2016-09-03 NOTE — Telephone Encounter (Signed)
Spoke with Ms. Viles informed her that she did have an episode of SVT on 08/28/2016 that is known, pt stated that she did not take the PRN Cardizem because the episode didn't last that long, pt stated that she sat down and did some deep breathing exercises and that seemed to help. Informed her that for the symptom episode on May 1, did not indicate any abnormalities, also informed pt that there were no other than when she pressed her symptom activator. Informed pt that the episode from 4/27 would be reviewed with Dr. Rayann Heman and if he had any further recommendations she would receive a call.

## 2016-09-08 DIAGNOSIS — L814 Other melanin hyperpigmentation: Secondary | ICD-10-CM | POA: Diagnosis not present

## 2016-09-08 DIAGNOSIS — D1801 Hemangioma of skin and subcutaneous tissue: Secondary | ICD-10-CM | POA: Diagnosis not present

## 2016-09-08 DIAGNOSIS — L859 Epidermal thickening, unspecified: Secondary | ICD-10-CM | POA: Diagnosis not present

## 2016-09-08 DIAGNOSIS — L821 Other seborrheic keratosis: Secondary | ICD-10-CM | POA: Diagnosis not present

## 2016-09-09 ENCOUNTER — Ambulatory Visit (INDEPENDENT_AMBULATORY_CARE_PROVIDER_SITE_OTHER): Payer: Medicare Other | Admitting: *Deleted

## 2016-09-09 DIAGNOSIS — R002 Palpitations: Secondary | ICD-10-CM | POA: Diagnosis not present

## 2016-09-10 NOTE — Progress Notes (Signed)
Carelink Summary Report / Loop Recorder 

## 2016-09-18 LAB — CUP PACEART REMOTE DEVICE CHECK
Implantable Pulse Generator Implant Date: 20180109
MDC IDC SESS DTM: 20180509202358

## 2016-09-21 DIAGNOSIS — J301 Allergic rhinitis due to pollen: Secondary | ICD-10-CM | POA: Diagnosis not present

## 2016-09-21 DIAGNOSIS — J4530 Mild persistent asthma, uncomplicated: Secondary | ICD-10-CM | POA: Diagnosis not present

## 2016-09-21 DIAGNOSIS — K219 Gastro-esophageal reflux disease without esophagitis: Secondary | ICD-10-CM | POA: Diagnosis not present

## 2016-09-21 DIAGNOSIS — J3089 Other allergic rhinitis: Secondary | ICD-10-CM | POA: Diagnosis not present

## 2016-09-29 ENCOUNTER — Encounter (HOSPITAL_COMMUNITY): Payer: Self-pay | Admitting: Emergency Medicine

## 2016-09-29 ENCOUNTER — Emergency Department (HOSPITAL_COMMUNITY)
Admission: EM | Admit: 2016-09-29 | Discharge: 2016-09-29 | Disposition: A | Discharge status: Home / Self Care | Payer: Medicare Other | Attending: Emergency Medicine | Admitting: Emergency Medicine

## 2016-09-29 ENCOUNTER — Telehealth: Payer: Self-pay | Admitting: *Deleted

## 2016-09-29 ENCOUNTER — Emergency Department (HOSPITAL_COMMUNITY): Payer: Medicare Other

## 2016-09-29 DIAGNOSIS — Z79899 Other long term (current) drug therapy: Secondary | ICD-10-CM | POA: Insufficient documentation

## 2016-09-29 DIAGNOSIS — I471 Supraventricular tachycardia: Secondary | ICD-10-CM | POA: Insufficient documentation

## 2016-09-29 DIAGNOSIS — J45909 Unspecified asthma, uncomplicated: Secondary | ICD-10-CM | POA: Diagnosis not present

## 2016-09-29 DIAGNOSIS — R000 Tachycardia, unspecified: Secondary | ICD-10-CM | POA: Diagnosis not present

## 2016-09-29 DIAGNOSIS — R079 Chest pain, unspecified: Secondary | ICD-10-CM | POA: Diagnosis present

## 2016-09-29 DIAGNOSIS — R002 Palpitations: Secondary | ICD-10-CM | POA: Diagnosis not present

## 2016-09-29 DIAGNOSIS — E039 Hypothyroidism, unspecified: Secondary | ICD-10-CM | POA: Insufficient documentation

## 2016-09-29 LAB — CBC
HCT: 42.5 % (ref 36.0–46.0)
Hemoglobin: 13.8 g/dL (ref 12.0–15.0)
MCH: 29.1 pg (ref 26.0–34.0)
MCHC: 32.5 g/dL (ref 30.0–36.0)
MCV: 89.5 fL (ref 78.0–100.0)
PLATELETS: 249 10*3/uL (ref 150–400)
RBC: 4.75 MIL/uL (ref 3.87–5.11)
RDW: 14.3 % (ref 11.5–15.5)
WBC: 10.9 10*3/uL — AB (ref 4.0–10.5)

## 2016-09-29 LAB — I-STAT TROPONIN, ED: TROPONIN I, POC: 0.02 ng/mL (ref 0.00–0.08)

## 2016-09-29 LAB — BASIC METABOLIC PANEL
Anion gap: 9 (ref 5–15)
BUN: 11 mg/dL (ref 6–20)
CALCIUM: 9.4 mg/dL (ref 8.9–10.3)
CO2: 25 mmol/L (ref 22–32)
CREATININE: 0.91 mg/dL (ref 0.44–1.00)
Chloride: 104 mmol/L (ref 101–111)
Glucose, Bld: 115 mg/dL — ABNORMAL HIGH (ref 65–99)
Potassium: 3.9 mmol/L (ref 3.5–5.1)
SODIUM: 138 mmol/L (ref 135–145)

## 2016-09-29 MED ORDER — METOPROLOL TARTRATE 25 MG PO TABS: 25.0000 mg | ORAL_TABLET | Freq: Once | ORAL | Status: DC

## 2016-09-29 MED ORDER — METOPROLOL TARTRATE 25 MG PO TABS: 25.0000 mg | ORAL_TABLET | Freq: Two times a day (BID) | ORAL | 0 refills | Status: DC

## 2016-09-29 MED ORDER — METOPROLOL TARTRATE 25 MG PO TABS
12.5000 mg | ORAL_TABLET | Freq: Once | ORAL | Status: AC
  Administered 2016-09-29: 12.5 mg via ORAL
  Filled 2016-09-29: qty 1

## 2016-09-29 NOTE — ED Notes (Signed)
Patient able to ambulate independently  

## 2016-09-29 NOTE — Telephone Encounter (Signed)
Mr. Rankin calling to make Dr. Rayann Heman aware that Kendra Russell is in the ER to be evaluated. She was experiencing some new symptoms (tingling in arms).  Will route to Dr. Rayann Heman

## 2016-09-29 NOTE — ED Provider Notes (Signed)
[facility] DEPT Provider Note   CSN: [id number] Arrival date & time: 09/29/16  1533   History   Chief Complaint Chief Complaint  Patient presents with  . Chest Pain  . Palpitations    HPI Kendra Russell is a 66 y.o. female.  HPI   66 year old female presents today with complaints of palpitations. Patient notes a significant past medical history of SVT currently followed by Dr. Rayann Heman. Patient up to this point has wanted to hold off on medication for significant intervention. She notes runs of SVT have been happening more frequently. She notes several over the last several days. This morning after eating and drinking several cups coffee she felt some tingling in her left and right arms, and central chest. She notes this is associated with rapid heart rate, this is similar to previous episodes. She notes the symptoms lasted approximately 30-40 minutes, she denies any chest pain associated with this. Patient is currently wearing a loop recorder.  Patient also notes a 5 a 10 year history of belching, unchanged from baseline.  Past Medical History:  Diagnosis Date  . Arthritis   . Asthma   . Hypothyroidism 1997  . Insomnia   . Palpitations     Patient Active Problem List   Diagnosis Date Noted  . SVT (supraventricular tachycardia) ([place]) 06/26/2016    Past Surgical History:  Procedure Laterality Date  . EP IMPLANTABLE DEVICE N/A 05/12/2016   Procedure: Loop Recorder Insertion;  Surgeon: Thompson Grayer, MD;  Location: [facility];  Service: Cardiovascular;  Laterality: N/A;  . KNEE SURGERY      OB History    No data available       Home Medications    Prior to Admission medications   Medication Sig Start Date End Date Taking? Authorizing Provider  albuterol (PROVENTIL HFA;VENTOLIN HFA) 108 (90 Base) MCG/ACT inhaler Inhale 2 puffs into the lungs every 4 (four) hours as needed for wheezing or shortness of breath.   Yes [provider]  BREO ELLIPTA  200-25 MCG/INH AEPB Inhale 1 puff into the lungs daily.  05/06/16  Yes [provider]  clindamycin (CLEOCIN) 150 MG capsule Take 150 mg by mouth once. TAKE 4 CAPSULE 30-90 MINUTES BEFORE ANY PROCEDURE (Dental, Surgeries, etc...)   Yes [provider]  diclofenac sodium (VOLTAREN) 1 % GEL Apply 1 application topically daily as needed (knee pain).   Yes [provider]  diltiazem (CARDIZEM) 30 MG tablet Take 1 tablet (30 mg total) by mouth daily as needed (palpitations). 06/26/16  Yes Seiler, Amber K, NP  fluticasone (FLONASE) 50 MCG/ACT nasal spray Place 2 sprays into both nostrils daily.   Yes [provider]  Hypertonic Nasal Wash (SINUS RINSE NA) Place 1 applicator into the nose every other day.   Yes [provider]  levothyroxine (SYNTHROID, LEVOTHROID) 125 MCG tablet Take 125 mcg by mouth daily before breakfast.   Yes [provider]  Melatonin 5 MG TABS Take 5 mg by mouth at bedtime.    Yes [provider]  Methylcellulose, Laxative, (CITRUCEL) 500 MG TABS Take 1 tablet by mouth at bedtime.   Yes [provider]  Multiple Minerals-Vitamins (CALCIUM CITRATE-MAG-MINERALS) TABS Take 1 tablet by mouth daily.    Yes [provider]  metoprolol tartrate (LOPRESSOR) 25 MG tablet Take 1 tablet (25 mg total) by mouth 2 (two) times daily. 09/29/16   Okey Regal, PA-C    Family History Family History  Problem Relation Age of Onset  .  Atrial fibrillation Mother   . Atrial fibrillation Sister     Social History Social History  Substance Use Topics  . Smoking status: Never Smoker  . Smokeless tobacco: Never Used  . Alcohol use No     Allergies   Other; Penicillins; Pepcid [famotidine]; and Sulfa antibiotics   Review of Systems Review of Systems  All other systems reviewed and are negative.   Physical Exam Updated Vital Signs BP (!) 153/76   Pulse 79   Resp 19   SpO2 98%   Physical Exam    Constitutional: She is oriented to person, place, and time. She appears well-developed and well-nourished.  Frequent belching noted   HENT:  Head: Normocephalic and atraumatic.  Eyes: Conjunctivae are normal. Pupils are equal, round, and reactive to light. Right eye exhibits no discharge. Left eye exhibits no discharge. No scleral icterus.  Neck: Normal range of motion. No JVD present. No tracheal deviation present.  Cardiovascular: Normal rate, regular rhythm, normal heart sounds and intact distal pulses.  Exam reveals no gallop and no friction rub.   No murmur heard. Pulmonary/Chest: Effort normal and breath sounds normal. No stridor. No respiratory distress. She has no wheezes. She has no rales. She exhibits no tenderness.  Neurological: She is alert and oriented to person, place, and time. Coordination normal.  Psychiatric: She has a normal mood and affect. Her behavior is normal. Judgment and thought content normal.  Nursing note and vitals reviewed.   ED Treatments / Results  Labs (all labs ordered are listed, but only abnormal results are displayed) Labs Reviewed  BASIC METABOLIC PANEL - Abnormal; Notable for the following:       Result Value   Glucose, Bld 115 (*)    All other components within normal limits  CBC - Abnormal; Notable for the following:    WBC 10.9 (*)    All other components within normal limits  I-STAT TROPOININ, ED    EKG  EKG Interpretation  Date/Time:  Tuesday Sep 29 2016 15:38:43 EDT Ventricular Rate:  95 PR Interval:    QRS Duration: 97 QT Interval:  366 QTC Calculation: 461 R Axis:   -19 Text Interpretation:  Sinus tachycardia Atrial premature complexes in couplets Borderline left axis deviation Low voltage, precordial leads Borderline T abnormalities, inferior leads Confirmed by Isla Pence [phone]) on 09/29/2016 5:08:41 PM       Radiology Dg Chest 2 View  Result Date: 09/29/2016 CLINICAL DATA:  Dictations, tingling in the arms and  left hand and fingers with heaviness. Recently diagnosed with SVT. EXAM: CHEST  2 VIEW COMPARISON:  Chest x-ray of February 05, 2016 FINDINGS: The lungs are well-expanded. The heart and pulmonary vascularity are normal. The mediastinum is normal in width. There is calcification in the wall of the aortic arch. An implantable cardiac rhythm mild monitoring device is present. The bony thorax exhibits no acute abnormality. There is chronic deformity of the distal aspect of the right clavicle. IMPRESSION: There is no acute cardiopulmonary abnormality. Thoracic aortic atherosclerosis. Electronically Signed   By: David  Martinique M.D.   On: 09/29/2016 16:39    Procedures Procedures (including critical care time)  Medications Ordered in ED Medications  metoprolol tartrate (LOPRESSOR) tablet 12.5 mg (12.5 mg Oral Given 09/29/16 1754)     Initial Impression / Assessment and Plan / ED Course  I have reviewed the triage vital signs and the nursing notes.  Pertinent labs & imaging results that were available during my care of the patient  were reviewed by me and considered in my medical decision making (see chart for details).       Final Clinical Impressions(s) / ED Diagnoses   Final diagnoses:  Palpitations  SVT (supraventricular tachycardia) (HCC)    Labs: I-STAT troponin, BMP, CBC  Imaging: DG chest 2 view  Consults:  Therapeutics: Metoprolol  Discharge Meds:   Assessment/Plan: 66 year old female with reports of SVT. She is well appearing in no acute distress here. She has reassuring vital signs no signs of SVT. Loop recorder analyzed showing no runs of SVT today. Patient did show episodes of PVC. Patient was initially hypotensive on scene and remained hypertensive here in the ED. After discussing with patient treatment options including blood pressure medication she agreed to initiate blood pressure management which will also likely benefit her SVT. Patient started on 12.5 of metoprolol here  in the ED. Patient will titrate to 25 mg 3 times a day as needed if no significant side effects occur. Patient will contact Dr. Rayann Heman and schedule follow-up evaluation. Patient well-appearing with no events while here in the ED, no chest pain palpitations or SVT. Patient discharged with strict return cautions. She verbalized understanding and agreement to today's plan had no further questions or concerns    New Prescriptions New Prescriptions   METOPROLOL TARTRATE (LOPRESSOR) 25 MG TABLET    Take 1 tablet (25 mg total) by mouth 2 (two) times daily.     Okey Regal, PA-C 09/29/16 1806    Isla Pence, MD 09/29/16 [phone]

## 2016-09-29 NOTE — ED Triage Notes (Signed)
Per EMS:  Pt presents to ED for assessment of episode of SVT (which patient has been being assessed for x 2 months).  Pt experienced palpitations, tingling down left arm, and heaviness.  All symptoms are gone, except the tingling.  Pt's pressure 223/130.  Stroke screen negative.  Patient burping constantly, states it is due to a GI allergy.

## 2016-09-29 NOTE — ED Notes (Signed)
PA at bedside.

## 2016-09-29 NOTE — ED Notes (Signed)
Pt transported to xray 

## 2016-09-29 NOTE — Discharge Instructions (Signed)
Please read attached information. If you experience any new or worsening signs or symptoms please return to the emergency room for evaluation. Please follow-up with your primary care provider or specialist as discussed. Please use medication prescribed only as directed and discontinue taking if you have any concerning signs or symptoms.   °

## 2016-10-02 ENCOUNTER — Other Ambulatory Visit: Payer: Self-pay | Admitting: Internal Medicine

## 2016-10-02 ENCOUNTER — Telehealth: Payer: Self-pay | Admitting: Cardiology

## 2016-10-02 ENCOUNTER — Telehealth: Payer: Self-pay | Admitting: *Deleted

## 2016-10-02 NOTE — Telephone Encounter (Signed)
Advised patient that transmission was just received (sent at 1414), presenting rhythm SR in the 46s.  No episodes noted.  Patient reports that her BP was 142/78 during this episode and that the symptoms have subsided at this point.  Patient reports she has not heard back from her PCP yet.  Advised that the ED is available over the weekend if she has acute symptoms and patient verbalizes understanding.  Patient is taking lopressor BID as instructed.  She has not taken the diltiazem yet for palpitations and she did not have it with her on the 26th when she had SVT episodes.  She was at her mother's house which had no A/C and she suddenly felt sick and believes she was overheated and dehydrated.  Advised patient to continue taking lopressor as instructed and that the diltiazem can be used in conjunction for fast heart rates/palpitations.  Patient verbalizes understanding and appreciation of explanation.  She denies additional questions or concerns at this time.

## 2016-10-02 NOTE — Telephone Encounter (Signed)
Patient's reported symptoms are the same as they were on Tuesday (per notes).  [facility] system is experiencing 8-10hr delays today (confirmed with tech services), so manual transmission will not show up before the end of the workday.  Gardiner Rhyme, CMA, to relay advice to follow PCP's recommendations and ED recommendations from 09/29/16 for return instructions/medication instructions.  No new recommendations at this time per Dr. Jackalyn Lombard review of all recent episodes (symptom and tachy/SVT episodes) on 10/01/16.  Patient is currently awaiting recommendations from PCP.

## 2016-10-02 NOTE — Telephone Encounter (Signed)
Opened in error

## 2016-10-02 NOTE — Telephone Encounter (Signed)
Patient called and reported that she went to the hospital on Tuesday. She stated that she had a SVT and PVC. She has a lot of the same symptoms today as on Tuesday.  Arms are heavy and weak, and she has the same tingling feeling, and burping. After consulting with Device Tech RN informed me to tell patient to call her PCP. Informed her to call her PCP and she stated that she had already talked with the Nurse at the PCP office and is awaiting a call back. Pt wanted to know if she should continue Metoprolol that she was instructed to take at the hospital. I told her to continue to take it as directed by the hospital. Pt is still going to send a remote transmission and she is aware that it could be Monday before she gets a call back due to a lag in the [facility] system. She is also waiting for a call back from PCP.

## 2016-10-07 DIAGNOSIS — R1013 Epigastric pain: Secondary | ICD-10-CM | POA: Diagnosis not present

## 2016-10-09 ENCOUNTER — Ambulatory Visit (INDEPENDENT_AMBULATORY_CARE_PROVIDER_SITE_OTHER): Payer: Medicare Other | Admitting: *Deleted

## 2016-10-09 DIAGNOSIS — R002 Palpitations: Secondary | ICD-10-CM

## 2016-10-09 NOTE — Progress Notes (Signed)
Carelink Summary Report / Loop Recorder 

## 2016-10-13 DIAGNOSIS — Z6830 Body mass index (BMI) 30.0-30.9, adult: Secondary | ICD-10-CM | POA: Diagnosis not present

## 2016-10-13 DIAGNOSIS — Z0000 Encounter for general adult medical examination without abnormal findings: Secondary | ICD-10-CM | POA: Diagnosis not present

## 2016-10-13 DIAGNOSIS — E785 Hyperlipidemia, unspecified: Secondary | ICD-10-CM | POA: Diagnosis not present

## 2016-10-13 DIAGNOSIS — Z5181 Encounter for therapeutic drug level monitoring: Secondary | ICD-10-CM | POA: Diagnosis not present

## 2016-10-13 DIAGNOSIS — R1013 Epigastric pain: Secondary | ICD-10-CM | POA: Diagnosis not present

## 2016-10-13 DIAGNOSIS — E039 Hypothyroidism, unspecified: Secondary | ICD-10-CM | POA: Diagnosis not present

## 2016-10-15 ENCOUNTER — Other Ambulatory Visit: Payer: Self-pay | Admitting: Family Medicine

## 2016-10-15 DIAGNOSIS — R1013 Epigastric pain: Secondary | ICD-10-CM

## 2016-10-19 ENCOUNTER — Ambulatory Visit (HOSPITAL_COMMUNITY): Admission: RE | Admit: 2016-10-19 | Payer: Medicare Other | Source: Ambulatory Visit

## 2016-10-20 LAB — CUP PACEART REMOTE DEVICE CHECK
Date Time Interrogation Session: 20180608144106
MDC IDC PG IMPLANT DT: 20180109

## 2016-10-26 ENCOUNTER — Encounter: Payer: Self-pay | Admitting: Vascular Surgery

## 2016-10-26 ENCOUNTER — Ambulatory Visit
Admission: RE | Admit: 2016-10-26 | Discharge: 2016-10-26 | Disposition: A | Discharge status: Home / Self Care | Payer: Medicare Other | Source: Ambulatory Visit | Attending: Family Medicine | Admitting: Family Medicine

## 2016-10-26 ENCOUNTER — Ambulatory Visit (INDEPENDENT_AMBULATORY_CARE_PROVIDER_SITE_OTHER): Payer: Medicare Other | Admitting: Vascular Surgery

## 2016-10-26 VITALS — BP 140/84 | HR 66 | Temp 97.8°F | Resp 16 | Ht 65.0 in | Wt 199.0 lb

## 2016-10-26 DIAGNOSIS — K760 Fatty (change of) liver, not elsewhere classified: Secondary | ICD-10-CM | POA: Diagnosis not present

## 2016-10-26 DIAGNOSIS — I8393 Asymptomatic varicose veins of bilateral lower extremities: Secondary | ICD-10-CM | POA: Diagnosis not present

## 2016-10-26 DIAGNOSIS — R1013 Epigastric pain: Secondary | ICD-10-CM

## 2016-10-26 NOTE — Progress Notes (Signed)
Subjective:     Patient ID: Kendra Russell, female   DOB: [birth year], 66 y.o.   MRN: [id number]  HPI This 65 year old female was referred by Dr. Maurice Small for evaluation of bilateral varicose veins. Patient has no history of DVT thrombophlebitis stasis ulcers or bleeding. She has had bilateral knee replacements. She has had diffuse "spider veins" in both thigh and calf and ankle areas over the past several years which have become more diffuse and prominent. She has a few very small bulges she states. She has no symptoms related to the disease. She does not have swelling in the ankles or feet as the day progresses. She does not elastic compression stockings.  Past Medical History:  Diagnosis Date  . Arthritis   . Asthma   . Hypothyroidism 1997  . Insomnia   . Palpitations   . Varicose veins of bilateral lower extremities with other complications     Social History  Substance Use Topics  . Smoking status: Never Smoker  . Smokeless tobacco: Never Used  . Alcohol use No    Family History  Problem Relation Age of Onset  . Atrial fibrillation Mother   . Atrial fibrillation Sister     Allergies  Allergen Reactions  . Other     Some pain meds (unsure of all names - Codeine is definitely one of them) -  Causes arms to jerk, hallucinations and nausea  . Penicillins     Mom told her she was allergic- unknown  . Pepcid [Famotidine]     Deep leg pains  . Sulfa Antibiotics     unknown     Current Outpatient Prescriptions:  .  ADVAIR HFA 115-21 MCG/ACT inhaler, , Disp: , Rfl:  .  albuterol (PROVENTIL HFA;VENTOLIN HFA) 108 (90 Base) MCG/ACT inhaler, Inhale 2 puffs into the lungs every 4 (four) hours as needed for wheezing or shortness of breath., Disp: , Rfl:  .  BREO ELLIPTA 200-25 MCG/INH AEPB, Inhale 1 puff into the lungs daily. , Disp: , Rfl:  .  diclofenac sodium (VOLTAREN) 1 % GEL, Apply 1 application topically daily as needed (knee pain)., Disp: , Rfl:  .  fluticasone  (FLONASE) 50 MCG/ACT nasal spray, Place 2 sprays into both nostrils daily., Disp: , Rfl:  .  Hypertonic Nasal Wash (SINUS RINSE NA), Place 1 applicator into the nose every other day., Disp: , Rfl:  .  levothyroxine (SYNTHROID, LEVOTHROID) 125 MCG tablet, Take 125 mcg by mouth daily before breakfast., Disp: , Rfl:  .  Melatonin 5 MG TABS, Take 5 mg by mouth at bedtime. , Disp: , Rfl:  .  Methylcellulose, Laxative, (CITRUCEL) 500 MG TABS, Take 1 tablet by mouth at bedtime., Disp: , Rfl:  .  metoprolol tartrate (LOPRESSOR) 25 MG tablet, Take 1 tablet (25 mg total) by mouth 2 (two) times daily., Disp: 60 tablet, Rfl: 0 .  Multiple Minerals-Vitamins (CALCIUM CITRATE-MAG-MINERALS) TABS, Take 1 tablet by mouth daily. , Disp: , Rfl:  .  mupirocin ointment (BACTROBAN) 2 %, , Disp: , Rfl:  .  valACYclovir (VALTREX) 1000 MG tablet, , Disp: , Rfl:  .  clindamycin (CLEOCIN) 150 MG capsule, Take 150 mg by mouth once. TAKE 4 CAPSULE 30-90 MINUTES BEFORE ANY PROCEDURE (Dental, Surgeries, etc...), Disp: , Rfl:  .  diltiazem (CARDIZEM) 30 MG tablet, Take 1 tablet (30 mg total) by mouth daily as needed (palpitations). (Patient not taking: Reported on 10/26/2016), Disp: 30 tablet, Rfl: 6  Vitals:   10/26/16 1457  BP:  140/84  Pulse: 66  Resp: 16  Temp: 97.8 F (36.6 C)  TempSrc: Oral  SpO2: 97%  Weight: 199 lb (90.3 kg)  Height: 5\' 5"  (1.651 m)    Body mass index is 33.12 kg/m.         Review of Systems Denies chest pain but does have asthma and dyspnea on exertion. Has history of supraventricular tachycardia followed by Dr. Rayann Heman. No coronary artery disease previous myocardial infarction CVA or other vascular issues. Other systems negative    Objective:   Physical Exam BP 140/84 (BP Location: Left Arm, Patient Position: Sitting, Cuff Size: Large)   Pulse 66   Temp 97.8 F (36.6 C) (Oral)   Resp 16   Ht 5\' 5"  (1.651 m)   Wt 199 lb (90.3 kg)   SpO2 97%   BMI 33.12 kg/m     Gen.-alert  and oriented x3 in no apparent distress HEENT normal for age Lungs no rhonchi or wheezing Cardiovascular regular rhythm no murmurs carotid pulses 3+ palpable no bruits audible Abdomen soft nontender no palpable masses Musculoskeletal free of  major deformities Skin clear -no rashes Neurologic normal Lower extremities 3+ femoral and dorsalis pedis pulses palpable bilaterally with no edema Both legs have extensive diffuse spider veins involving the proximal and distal medial and lateral thighs medial ankle areas and anterior ankle areas. There is no hyperpigmentation or ulceration noted. A few small bulges noted in the left medial calf which are approximately 4 mm in size.  Today I performed a bedside SonoSite ultrasound exam. Great saphenous vein bilaterally appears normal with no evidence reflux.       Assessment:     Spider veins-bilateral and diffuse with no symptoms    Plan:     Have explained the patient the technique involved for foam sclerotherapy and the fact that it would be quite a project to treat all of the spider veins in both her lower extremities. Also explained to her that this is not covered by insurance. Her questions were answered and if she decides she would like to get more information or pursue this she will be in touch with Thea Silversmith

## 2016-10-29 DIAGNOSIS — J069 Acute upper respiratory infection, unspecified: Secondary | ICD-10-CM | POA: Diagnosis not present

## 2016-10-29 DIAGNOSIS — J45909 Unspecified asthma, uncomplicated: Secondary | ICD-10-CM | POA: Diagnosis not present

## 2016-11-09 ENCOUNTER — Ambulatory Visit (INDEPENDENT_AMBULATORY_CARE_PROVIDER_SITE_OTHER): Payer: Medicare Other | Admitting: *Deleted

## 2016-11-09 DIAGNOSIS — R002 Palpitations: Secondary | ICD-10-CM | POA: Diagnosis not present

## 2016-11-10 NOTE — Progress Notes (Signed)
Carelink Summary Report / Loop Recorder 

## 2016-11-30 LAB — CUP PACEART REMOTE DEVICE CHECK
Date Time Interrogation Session: 20180708153738
Implantable Pulse Generator Implant Date: 20180109

## 2016-12-08 ENCOUNTER — Ambulatory Visit (INDEPENDENT_AMBULATORY_CARE_PROVIDER_SITE_OTHER): Payer: Medicare Other | Admitting: *Deleted

## 2016-12-08 DIAGNOSIS — R002 Palpitations: Secondary | ICD-10-CM

## 2016-12-09 NOTE — Progress Notes (Signed)
Carelink Summary Report / Loop Recorder 

## 2016-12-16 ENCOUNTER — Other Ambulatory Visit: Payer: Self-pay | Admitting: Internal Medicine

## 2016-12-16 LAB — CUP PACEART REMOTE DEVICE CHECK
Date Time Interrogation Session: 20180807160857
MDC IDC PG IMPLANT DT: 20180109

## 2016-12-28 ENCOUNTER — Encounter: Payer: Self-pay | Admitting: Internal Medicine

## 2017-01-07 ENCOUNTER — Ambulatory Visit (INDEPENDENT_AMBULATORY_CARE_PROVIDER_SITE_OTHER): Payer: Medicare Other | Admitting: *Deleted

## 2017-01-07 DIAGNOSIS — R002 Palpitations: Secondary | ICD-10-CM | POA: Diagnosis not present

## 2017-01-08 NOTE — Progress Notes (Signed)
Carelink Summary Report / Loop Recorder 

## 2017-01-12 LAB — CUP PACEART REMOTE DEVICE CHECK
Implantable Pulse Generator Implant Date: 20180109
MDC IDC SESS DTM: 20180906163902

## 2017-01-18 ENCOUNTER — Encounter: Payer: Self-pay | Admitting: Vascular Surgery

## 2017-01-20 ENCOUNTER — Telehealth: Payer: Self-pay | Admitting: Cardiology

## 2017-01-20 NOTE — Telephone Encounter (Signed)
LMOVM requesting that pt send manual transmission b/c home monitor has not updated in at least 14 days.    

## 2017-01-26 ENCOUNTER — Other Ambulatory Visit: Payer: Self-pay | Admitting: Internal Medicine

## 2017-02-01 ENCOUNTER — Ambulatory Visit (INDEPENDENT_AMBULATORY_CARE_PROVIDER_SITE_OTHER): Payer: Medicare Other | Admitting: Internal Medicine

## 2017-02-01 ENCOUNTER — Encounter (INDEPENDENT_AMBULATORY_CARE_PROVIDER_SITE_OTHER): Payer: Self-pay

## 2017-02-01 ENCOUNTER — Encounter: Payer: Medicare Other | Admitting: Internal Medicine

## 2017-02-01 ENCOUNTER — Encounter: Payer: Self-pay | Admitting: Internal Medicine

## 2017-02-01 VITALS — BP 132/72 | HR 70 | Ht 65.5 in | Wt 200.8 lb

## 2017-02-01 DIAGNOSIS — I10 Essential (primary) hypertension: Secondary | ICD-10-CM | POA: Diagnosis not present

## 2017-02-01 DIAGNOSIS — I471 Supraventricular tachycardia: Secondary | ICD-10-CM

## 2017-02-01 LAB — CUP PACEART INCLINIC DEVICE CHECK
Date Time Interrogation Session: 20181001151624
MDC IDC PG IMPLANT DT: 20180109

## 2017-02-01 MED ORDER — METOPROLOL TARTRATE 25 MG PO TABS: 25.0000 mg | ORAL_TABLET | Freq: Two times a day (BID) | ORAL | 11 refills | Status: DC

## 2017-02-01 NOTE — Progress Notes (Signed)
PCP: Maurice Small, MD   Primary EP: Dr Rayann Heman  Kendra Russell is a 66 y.o. female who presents today for routine electrophysiology followup.  Since last being seen in our clinic, the patient reports doing very well.  Her brother died 2017/02/02.  Today, she denies symptoms of palpitations, chest pain, shortness of breath,  lower extremity edema, dizziness, presyncope, or syncope.  The patient is otherwise without complaint today.   Past Medical History:  Diagnosis Date  . Arthritis   . Asthma   . Hypothyroidism 1997  . Insomnia   . Palpitations   . Varicose veins of bilateral lower extremities with other complications    Past Surgical History:  Procedure Laterality Date  . EP IMPLANTABLE DEVICE N/A 05/12/2016   Procedure: Loop Recorder Insertion;  Surgeon: Thompson Grayer, MD;  Location: [facility];  Service: Cardiovascular;  Laterality: N/A;  . KNEE SURGERY      ROS- all systems are reviewed and negatives except as per HPI above  Current Outpatient Prescriptions  Medication Sig Dispense Refill  . ADVAIR HFA 115-21 MCG/ACT inhaler Inhale into the lungs as directed.     Marland Kitchen albuterol (PROVENTIL HFA;VENTOLIN HFA) 108 (90 Base) MCG/ACT inhaler Inhale 2 puffs into the lungs every 4 (four) hours as needed for wheezing or shortness of breath.    . clindamycin (CLEOCIN) 150 MG capsule Take 150 mg by mouth as directed. TAKE 4 CAPSULE 30-90 MINUTES BEFORE ANY PROCEDURE (Dental, Surgeries, etc...)    . diclofenac sodium (VOLTAREN) 1 % GEL Apply 1 application topically daily as needed (knee pain).    Marland Kitchen diltiazem (CARDIZEM) 30 MG tablet Take 1 tablet (30 mg total) by mouth daily as needed (palpitations). 30 tablet 6  . fluticasone (FLONASE) 50 MCG/ACT nasal spray Place 2 sprays into both nostrils daily.    . Hypertonic Nasal Wash (SINUS RINSE NA) Place 1 applicator into the nose every other day.    . levothyroxine (SYNTHROID, LEVOTHROID) 125 MCG tablet Take 125 mcg by mouth daily before  breakfast.    . Melatonin 5 MG TABS Take 5 mg by mouth at bedtime.     . Methylcellulose, Laxative, (CITRUCEL) 500 MG TABS Take 1 tablet by mouth at bedtime.    . metoprolol tartrate (LOPRESSOR) 25 MG tablet Take 1 tablet (25 mg total) by mouth 2 (two) times daily. 60 tablet 0  . Multiple Minerals-Vitamins (CALCIUM CITRATE-MAG-MINERALS) TABS Take 1 tablet by mouth daily.     . valACYclovir (VALTREX) 1000 MG tablet as directed.      No current facility-administered medications for this visit.     Physical Exam: Vitals:   02/01/17 1353  BP: 132/72  Pulse: 70  SpO2: 97%  Weight: 200 lb 12.8 oz (91.1 kg)  Height: 5' 5.5" (1.664 m)    GEN- The patient is well appearing, alert and oriented x 3 today.   Head- normocephalic, atraumatic Eyes-  Sclera clear, conjunctiva pink Ears- hearing intact Oropharynx- clear Lungs- Clear to ausculation bilaterally, normal work of breathing Heart- Regular rate and rhythm, no murmurs, rubs or gallops, PMI not laterally displaced GI- soft, NT, ND, + BS Extremities- no clubbing, cyanosis, or edema  ILR is reviewed in detail today is personally reviewed and shows only SVT on 02-02-17 which correlates to her brothers death.  Assessment and Plan:  1. SVT Controlled and doing well Wishes to avoid ablation No changes today  2. HTN Stable No change required today  [facility] Return to see me in  a year unless problems arise  Thompson Grayer MD, [facility] 02/01/2017 2:10 PM

## 2017-02-01 NOTE — Patient Instructions (Addendum)
Medication Instructions:  Your physician recommends that you continue on your current medications as directed. Please refer to the Current Medication list given to you today.   Labwork: None ordered   Testing/Procedures: None ordered   Follow-Up: Remote monitoring is used to monitor your Pacemaker from home. This monitoring reduces the number of office visits required to check your device to one time per year. It allows [place] to keep an eye on the functioning of your device to ensure it is working properly. You are scheduled for a device check from home on 02/08/17. You may send your transmission at any time that day. If you have a wireless device, the transmission will be sent automatically. After your physician reviews your transmission, you will receive a postcard with your next transmission date.   Your physician wants you to follow-up in: 12 months with Dr Vallery Ridge will receive a reminder letter in the mail two months in advance. If you don't receive a letter, please call our office to schedule the follow-up appointment.    Any Other Special Instructions Will Be Listed Below (If Applicable).     If you need a refill on your cardiac medications before your next appointment, please call your pharmacy.

## 2017-02-08 ENCOUNTER — Ambulatory Visit (INDEPENDENT_AMBULATORY_CARE_PROVIDER_SITE_OTHER): Payer: Medicare Other | Admitting: *Deleted

## 2017-02-08 DIAGNOSIS — R002 Palpitations: Secondary | ICD-10-CM

## 2017-02-09 NOTE — Progress Notes (Signed)
Carelink Summary Report / Loop Recorder 

## 2017-02-11 LAB — CUP PACEART REMOTE DEVICE CHECK
Date Time Interrogation Session: 20181006173840
Implantable Pulse Generator Implant Date: 20180109

## 2017-02-15 DIAGNOSIS — L0390 Cellulitis, unspecified: Secondary | ICD-10-CM | POA: Diagnosis not present

## 2017-02-15 DIAGNOSIS — L739 Follicular disorder, unspecified: Secondary | ICD-10-CM | POA: Diagnosis not present

## 2017-02-22 DIAGNOSIS — Z23 Encounter for immunization: Secondary | ICD-10-CM | POA: Diagnosis not present

## 2017-03-08 ENCOUNTER — Ambulatory Visit (INDEPENDENT_AMBULATORY_CARE_PROVIDER_SITE_OTHER): Payer: Medicare Other | Admitting: *Deleted

## 2017-03-08 DIAGNOSIS — R002 Palpitations: Secondary | ICD-10-CM | POA: Diagnosis not present

## 2017-03-09 ENCOUNTER — Other Ambulatory Visit: Payer: Self-pay | Admitting: Family Medicine

## 2017-03-09 DIAGNOSIS — Z1231 Encounter for screening mammogram for malignant neoplasm of breast: Secondary | ICD-10-CM

## 2017-03-09 NOTE — Progress Notes (Signed)
Carelink Summary Report / Loop Recorder 

## 2017-03-11 LAB — CUP PACEART REMOTE DEVICE CHECK
Date Time Interrogation Session: 20181105184245
Implantable Pulse Generator Implant Date: 20180109

## 2017-03-15 ENCOUNTER — Encounter: Payer: Medicare Other | Admitting: Internal Medicine

## 2017-04-02 DIAGNOSIS — K219 Gastro-esophageal reflux disease without esophagitis: Secondary | ICD-10-CM | POA: Diagnosis not present

## 2017-04-02 DIAGNOSIS — J3089 Other allergic rhinitis: Secondary | ICD-10-CM | POA: Diagnosis not present

## 2017-04-02 DIAGNOSIS — J301 Allergic rhinitis due to pollen: Secondary | ICD-10-CM | POA: Diagnosis not present

## 2017-04-02 DIAGNOSIS — J4530 Mild persistent asthma, uncomplicated: Secondary | ICD-10-CM | POA: Diagnosis not present

## 2017-04-06 ENCOUNTER — Ambulatory Visit: Payer: Medicare Other

## 2017-04-07 ENCOUNTER — Ambulatory Visit (INDEPENDENT_AMBULATORY_CARE_PROVIDER_SITE_OTHER): Payer: Medicare Other | Admitting: *Deleted

## 2017-04-07 DIAGNOSIS — R002 Palpitations: Secondary | ICD-10-CM

## 2017-04-08 NOTE — Progress Notes (Signed)
Carelink Summary Report / Loop Recorder 

## 2017-04-17 LAB — CUP PACEART REMOTE DEVICE CHECK
Implantable Pulse Generator Implant Date: 20180109
MDC IDC SESS DTM: 20181205191024

## 2017-04-20 ENCOUNTER — Ambulatory Visit
Admission: RE | Admit: 2017-04-20 | Discharge: 2017-04-20 | Disposition: A | Discharge status: Home / Self Care | Payer: Medicare Other | Source: Ambulatory Visit | Attending: Family Medicine | Admitting: Family Medicine

## 2017-04-20 DIAGNOSIS — Z1231 Encounter for screening mammogram for malignant neoplasm of breast: Secondary | ICD-10-CM

## 2017-05-07 ENCOUNTER — Ambulatory Visit (INDEPENDENT_AMBULATORY_CARE_PROVIDER_SITE_OTHER): Payer: Medicare Other | Admitting: *Deleted

## 2017-05-07 DIAGNOSIS — R002 Palpitations: Secondary | ICD-10-CM | POA: Diagnosis not present

## 2017-05-10 NOTE — Progress Notes (Signed)
Carelink Summary Report / Loop Recorder 

## 2017-05-21 LAB — CUP PACEART REMOTE DEVICE CHECK
Date Time Interrogation Session: 20190104191151
MDC IDC PG IMPLANT DT: 20180109

## 2017-05-24 ENCOUNTER — Telehealth: Payer: Self-pay | Admitting: Internal Medicine

## 2017-05-24 DIAGNOSIS — M1990 Unspecified osteoarthritis, unspecified site: Secondary | ICD-10-CM | POA: Diagnosis not present

## 2017-05-24 DIAGNOSIS — R5382 Chronic fatigue, unspecified: Secondary | ICD-10-CM | POA: Diagnosis not present

## 2017-05-24 NOTE — Telephone Encounter (Signed)
Pt c/o medication issue:  1. Name of Medication:  metoprolol tartrate (LOPRESSOR) 25 MG tablet  2. How are you currently taking this medication (dosage and times per day)?  Take 1 tablet (25 mg total) by mouth 2 (two) times daily.  3. Are you having a reaction (difficulty breathing--STAT)? no  4. What is your medication issue? Joints hurt, continuous cough and sneezing spells   Pt verbalized that she went to her PCP and they advised her to cut it in half

## 2017-05-24 NOTE — Telephone Encounter (Addendum)
Feels Metoprolol is causing side effect.  Coughing, unsteady, joints aching, tired.  Dr Justin Mend wanted her to stop today and she is getting labs.  So she decreased the dose to 12.5 mg bid Has had a lot going on(brother died). Questioned if she was depressed.  She knows that she does not tolerated medications well.

## 2017-05-27 NOTE — Telephone Encounter (Signed)
Spoke to the patient and she does feel some better but she feels she needs a few more days with the lower dose of her Metoprolol.  I did let jer know Dr Rayann Heman felt this was a good idea.  She is aware and knows I will be in touch with her on Monday to see if she still feels better on lower dose.

## 2017-05-31 NOTE — Telephone Encounter (Signed)
lmom for patient that I was calling to see if she was improved on lower dose of Metoprolol

## 2017-06-07 ENCOUNTER — Ambulatory Visit (INDEPENDENT_AMBULATORY_CARE_PROVIDER_SITE_OTHER): Payer: Medicare Other | Admitting: *Deleted

## 2017-06-07 DIAGNOSIS — R002 Palpitations: Secondary | ICD-10-CM | POA: Diagnosis not present

## 2017-06-07 NOTE — Progress Notes (Signed)
[facility] Summary Reprot / Loop Recorder 

## 2017-06-18 MED ORDER — METOPROLOL TARTRATE 25 MG PO TABS: 12.5000 mg | ORAL_TABLET | Freq: Two times a day (BID) | ORAL | 3 refills | Status: DC

## 2017-06-18 NOTE — Telephone Encounter (Signed)
Left another message for patient to call and let me know how she is feeling on lower dose of Metoprolol.  Let her know if I did not hear back from her would close out this telephone note.

## 2017-06-18 NOTE — Telephone Encounter (Signed)
She returned my call and is feeling much better on lower dose of Metoprolol.  She can tell a huge difference in her energy.

## 2017-06-21 DIAGNOSIS — J309 Allergic rhinitis, unspecified: Secondary | ICD-10-CM | POA: Diagnosis not present

## 2017-06-21 DIAGNOSIS — H02843 Edema of right eye, unspecified eyelid: Secondary | ICD-10-CM | POA: Diagnosis not present

## 2017-06-21 DIAGNOSIS — H02846 Edema of left eye, unspecified eyelid: Secondary | ICD-10-CM | POA: Diagnosis not present

## 2017-06-29 LAB — CUP PACEART REMOTE DEVICE CHECK
Date Time Interrogation Session: 20190203193925
MDC IDC PG IMPLANT DT: 20180109

## 2017-07-09 ENCOUNTER — Ambulatory Visit (INDEPENDENT_AMBULATORY_CARE_PROVIDER_SITE_OTHER): Payer: Medicare Other | Admitting: *Deleted

## 2017-07-09 DIAGNOSIS — R002 Palpitations: Secondary | ICD-10-CM | POA: Diagnosis not present

## 2017-07-12 NOTE — Progress Notes (Signed)
Carelink Summary Report / Loop Recorder 

## 2017-08-02 ENCOUNTER — Telehealth: Payer: Self-pay | Admitting: *Deleted

## 2017-08-02 DIAGNOSIS — J45909 Unspecified asthma, uncomplicated: Secondary | ICD-10-CM | POA: Diagnosis not present

## 2017-08-02 DIAGNOSIS — R030 Elevated blood-pressure reading, without diagnosis of hypertension: Secondary | ICD-10-CM | POA: Diagnosis not present

## 2017-08-02 NOTE — Telephone Encounter (Signed)
Kendra Russell does not recall symptoms at the time of the episode. She was in [place] at her mothers celebrating her late brother's birthday. She reports it was an emotional weekend. She will call back if she recalls symptoms at a later time.

## 2017-08-02 NOTE — Telephone Encounter (Signed)
LMOM to return call to [facility].  07/23/17 tachy episode- evaluate for symptoms.

## 2017-08-11 ENCOUNTER — Ambulatory Visit (INDEPENDENT_AMBULATORY_CARE_PROVIDER_SITE_OTHER): Payer: Medicare Other | Admitting: *Deleted

## 2017-08-11 DIAGNOSIS — R002 Palpitations: Secondary | ICD-10-CM | POA: Diagnosis not present

## 2017-08-12 DIAGNOSIS — J45901 Unspecified asthma with (acute) exacerbation: Secondary | ICD-10-CM | POA: Diagnosis not present

## 2017-08-12 DIAGNOSIS — I471 Supraventricular tachycardia: Secondary | ICD-10-CM | POA: Diagnosis not present

## 2017-08-12 NOTE — Progress Notes (Signed)
Carelink Summary Report / Loop Recorder 

## 2017-08-19 DIAGNOSIS — M7989 Other specified soft tissue disorders: Secondary | ICD-10-CM | POA: Diagnosis not present

## 2017-08-19 DIAGNOSIS — R05 Cough: Secondary | ICD-10-CM | POA: Diagnosis not present

## 2017-08-19 LAB — CUP PACEART REMOTE DEVICE CHECK
Implantable Pulse Generator Implant Date: 20180109
MDC IDC SESS DTM: 20190308203954

## 2017-09-09 ENCOUNTER — Other Ambulatory Visit: Payer: Self-pay | Admitting: Internal Medicine

## 2017-09-13 ENCOUNTER — Ambulatory Visit (INDEPENDENT_AMBULATORY_CARE_PROVIDER_SITE_OTHER): Payer: Medicare Other | Admitting: *Deleted

## 2017-09-13 DIAGNOSIS — R002 Palpitations: Secondary | ICD-10-CM

## 2017-09-14 NOTE — Progress Notes (Signed)
Carelink Summary Report / Loop Recorder 

## 2017-09-15 LAB — CUP PACEART REMOTE DEVICE CHECK
Date Time Interrogation Session: 20190410203519
Implantable Pulse Generator Implant Date: 20180109

## 2017-09-20 ENCOUNTER — Other Ambulatory Visit: Payer: Self-pay | Admitting: Internal Medicine

## 2017-09-20 DIAGNOSIS — S6992XA Unspecified injury of left wrist, hand and finger(s), initial encounter: Secondary | ICD-10-CM | POA: Diagnosis not present

## 2017-10-01 ENCOUNTER — Telehealth: Payer: Self-pay | Admitting: Cardiology

## 2017-10-01 NOTE — Telephone Encounter (Signed)
LMOVM requesting that pt send manual transmission b/c home monitor has not updated in at least 14 days.    

## 2017-10-05 ENCOUNTER — Other Ambulatory Visit: Payer: Self-pay | Admitting: Internal Medicine

## 2017-10-06 LAB — CUP PACEART REMOTE DEVICE CHECK
Date Time Interrogation Session: 20190513203641
Implantable Pulse Generator Implant Date: 20180109

## 2017-10-18 ENCOUNTER — Ambulatory Visit (INDEPENDENT_AMBULATORY_CARE_PROVIDER_SITE_OTHER): Payer: Medicare Other | Admitting: *Deleted

## 2017-10-18 DIAGNOSIS — R002 Palpitations: Secondary | ICD-10-CM

## 2017-10-18 NOTE — Progress Notes (Signed)
Carelink Summary Report / Loop Recorder 

## 2017-10-21 DIAGNOSIS — M1712 Unilateral primary osteoarthritis, left knee: Secondary | ICD-10-CM | POA: Diagnosis not present

## 2017-10-21 DIAGNOSIS — M1711 Unilateral primary osteoarthritis, right knee: Secondary | ICD-10-CM | POA: Diagnosis not present

## 2017-11-01 DIAGNOSIS — Z0000 Encounter for general adult medical examination without abnormal findings: Secondary | ICD-10-CM | POA: Diagnosis not present

## 2017-11-01 DIAGNOSIS — E785 Hyperlipidemia, unspecified: Secondary | ICD-10-CM | POA: Diagnosis not present

## 2017-11-01 DIAGNOSIS — Z5181 Encounter for therapeutic drug level monitoring: Secondary | ICD-10-CM | POA: Diagnosis not present

## 2017-11-01 DIAGNOSIS — Z1211 Encounter for screening for malignant neoplasm of colon: Secondary | ICD-10-CM | POA: Diagnosis not present

## 2017-11-01 DIAGNOSIS — E039 Hypothyroidism, unspecified: Secondary | ICD-10-CM | POA: Diagnosis not present

## 2017-11-02 DIAGNOSIS — Z1211 Encounter for screening for malignant neoplasm of colon: Secondary | ICD-10-CM | POA: Diagnosis not present

## 2017-11-08 ENCOUNTER — Telehealth: Payer: Self-pay | Admitting: *Deleted

## 2017-11-08 NOTE — Telephone Encounter (Signed)
Received ILR alert for 6 "tachy" episodes.  Available ECG appears ?SVT (unable to see onset), duration 46min 22sec from 10/26/17 at 1800.  Spoke with patient.  Requested manual Carelink transmission for review of additional episodes.  Patient reports that at the time of available ECG, she was at her granddaughter's swim meet.  It was 90 degrees and she felt overheated and some palpitations.  Earlier that day, she and her husband had a big argument.  Patient took 12.5mg  lopressor during the swim meet due to her symptoms, which resolved shortly after.  No symptoms earlier in the day.  Patient feels tachycardia was related to the stress she experienced that day.  Per patient, PCP had her d/c daily scheduled lopressor due to fatigue.  Currently taking 12.5mg  (half tab) PRN for palpitations.  She has not yet tried PRN diltiazem as she is afraid that she won't respond well to it.  Advised patient that I will call her with Dr. Jackalyn Lombard recommendations after her manual transmission is received and reviewed.  Patient verbalizes understanding and agreement with plan.

## 2017-11-10 NOTE — Telephone Encounter (Signed)
Reviewed with Dr. Rayann Heman, who recommended no changes at this time.  Advised patient to avoid taking both lopressor (changed to 12.5mg  PRN by PCP) and PRN diltiazem at the same time for symptomatic palpitations.  She will plan to continue using lopressor PRN as she is most comfortable with it, though she does have diltiazem if needed.  She strongly feels that her SVT episodes are precipitated by stress, which she tries to avoid.  Encouraged patient to call our office for new or worsening symptoms.  Will continue to monitor remotely via Carelink monitor.  Patient is aware of appointment with Dr. Rayann Heman on 02/07/18 and is appreciative of call and explanation.

## 2017-11-18 ENCOUNTER — Other Ambulatory Visit: Payer: Self-pay | Admitting: Internal Medicine

## 2017-11-18 ENCOUNTER — Ambulatory Visit (INDEPENDENT_AMBULATORY_CARE_PROVIDER_SITE_OTHER): Payer: Medicare Other | Admitting: *Deleted

## 2017-11-18 DIAGNOSIS — R002 Palpitations: Secondary | ICD-10-CM | POA: Diagnosis not present

## 2017-11-19 NOTE — Progress Notes (Signed)
Carelink Summary Report / Loop Recorder 

## 2017-11-22 LAB — CUP PACEART REMOTE DEVICE CHECK
Date Time Interrogation Session: 20190615213915
Implantable Pulse Generator Implant Date: 20180109

## 2017-12-20 ENCOUNTER — Telehealth: Payer: Self-pay

## 2017-12-20 NOTE — Telephone Encounter (Signed)
Pt felt lightheaded, felt like dim, she thinks she might have been dehydrated. Pt felt like her heart was going a little fast. Pt a little stress her nephew overdosed, and her husband cant drive. She sent a manual transmission. I let her talk with the device tech nurse.

## 2017-12-20 NOTE — Telephone Encounter (Signed)
ECGs printed and placed in Dr. Otilio Connors folder for review. Next f/u with Dr. Rayann Heman on 02/07/18. See example ECGs below.

## 2017-12-20 NOTE — Telephone Encounter (Signed)
Ms. Huether has been out of town since 12/13/17 in [place] due to nephew's passing. She was returning home yesterday and she started to not feel well, she thought she needed to eat. She stopped and got donuts- this caused belching. She stopped some time later at [facility]. Upon standing from using the restroom she felt odd, she washed her hands and when the hand dryer turned on she felt sick. She got light headed, her eyes narrowed but she did not lose vision she had her niece help her to a seat. She took her 12.5mg  metoprolol (forgot about diltiazem rx), drank 2 bottles of water, ate a meal and rested at [facility] for 2-2.5 hours. She is very tired today but this could be related to the last week's events.  7 tachy episodes noted 12/19/17 from 3:25-8pm. Average V rate 176bpm.  These will be reviewed with Dr. Rayann Heman and we will call back with any recommendations.

## 2017-12-21 ENCOUNTER — Ambulatory Visit (INDEPENDENT_AMBULATORY_CARE_PROVIDER_SITE_OTHER): Payer: Medicare Other | Admitting: *Deleted

## 2017-12-21 DIAGNOSIS — R002 Palpitations: Secondary | ICD-10-CM

## 2017-12-22 NOTE — Progress Notes (Signed)
Carelink Summary Report / Loop Recorder 

## 2017-12-22 NOTE — Telephone Encounter (Signed)
Continue current management.  I would be happy to see her sooner than scheduled if she would like to reconsider ablation.

## 2017-12-23 NOTE — Telephone Encounter (Signed)
Spoke with patient. Discussed use of diltiazem 30mg  once daily PRN for palpitations. Patient reports she will try this next time she has an episode since she does not like the way the metoprolol makes her feel. Patient plans to think about her options and call back if she wishes to schedule sooner f/u with Dr. Rayann Heman to discuss ablation. She is appreciative of call and denies additional questions or concerns at this time.

## 2018-01-06 LAB — CUP PACEART REMOTE DEVICE CHECK
Date Time Interrogation Session: 20190718221047
Implantable Pulse Generator Implant Date: 20180109

## 2018-01-14 DIAGNOSIS — Z23 Encounter for immunization: Secondary | ICD-10-CM | POA: Diagnosis not present

## 2018-01-14 DIAGNOSIS — R143 Flatulence: Secondary | ICD-10-CM | POA: Diagnosis not present

## 2018-01-14 DIAGNOSIS — E039 Hypothyroidism, unspecified: Secondary | ICD-10-CM | POA: Diagnosis not present

## 2018-01-14 DIAGNOSIS — K219 Gastro-esophageal reflux disease without esophagitis: Secondary | ICD-10-CM | POA: Diagnosis not present

## 2018-01-18 ENCOUNTER — Other Ambulatory Visit: Payer: Self-pay | Admitting: Internal Medicine

## 2018-01-18 ENCOUNTER — Encounter: Payer: Self-pay | Admitting: Internal Medicine

## 2018-01-24 ENCOUNTER — Ambulatory Visit (INDEPENDENT_AMBULATORY_CARE_PROVIDER_SITE_OTHER): Payer: Medicare Other | Admitting: *Deleted

## 2018-01-24 DIAGNOSIS — R002 Palpitations: Secondary | ICD-10-CM

## 2018-01-24 LAB — CUP PACEART REMOTE DEVICE CHECK
Implantable Pulse Generator Implant Date: 20180109
MDC IDC SESS DTM: 20190820223910

## 2018-01-24 NOTE — Progress Notes (Signed)
Carelink Summary Report / Loop Recorder 

## 2018-01-31 LAB — CUP PACEART REMOTE DEVICE CHECK
MDC IDC PG IMPLANT DT: 20180109
MDC IDC SESS DTM: 20190922233706

## 2018-02-07 ENCOUNTER — Encounter: Payer: Medicare Other | Admitting: Internal Medicine

## 2018-02-25 ENCOUNTER — Ambulatory Visit (INDEPENDENT_AMBULATORY_CARE_PROVIDER_SITE_OTHER): Payer: Medicare Other | Admitting: *Deleted

## 2018-02-25 DIAGNOSIS — R002 Palpitations: Secondary | ICD-10-CM | POA: Diagnosis not present

## 2018-02-26 NOTE — Progress Notes (Signed)
Carelink Summary Report / Loop Recorder 

## 2018-03-10 ENCOUNTER — Telehealth: Payer: Self-pay

## 2018-03-10 NOTE — Telephone Encounter (Signed)
LMOVM reminding pt to send remote transmission.   

## 2018-03-11 LAB — CUP PACEART REMOTE DEVICE CHECK
Date Time Interrogation Session: 20191025233701
MDC IDC PG IMPLANT DT: 20180109

## 2018-03-18 ENCOUNTER — Other Ambulatory Visit: Payer: Self-pay | Admitting: Internal Medicine

## 2018-03-21 ENCOUNTER — Ambulatory Visit (INDEPENDENT_AMBULATORY_CARE_PROVIDER_SITE_OTHER): Payer: Medicare Other | Admitting: Internal Medicine

## 2018-03-21 ENCOUNTER — Encounter: Payer: Self-pay | Admitting: Internal Medicine

## 2018-03-21 VITALS — BP 130/76 | HR 80 | Ht 65.0 in | Wt 186.8 lb

## 2018-03-21 DIAGNOSIS — I10 Essential (primary) hypertension: Secondary | ICD-10-CM | POA: Diagnosis not present

## 2018-03-21 DIAGNOSIS — I471 Supraventricular tachycardia: Secondary | ICD-10-CM

## 2018-03-21 LAB — CUP PACEART INCLINIC DEVICE CHECK
Implantable Pulse Generator Implant Date: 20180109
MDC IDC SESS DTM: 20191118163959

## 2018-03-21 NOTE — Progress Notes (Signed)
PCP: Maurice Small, MD   Primary EP: Dr Rayann Heman  Kendra Russell is a 67 y.o. female who presents today for routine electrophysiology followup.  Since last being seen in our clinic, the patient reports doing very well.  Today, she denies symptoms of palpitations, chest pain, shortness of breath,  lower extremity edema, dizziness, presyncope, or syncope.  The patient is otherwise without complaint today.   Past Medical History:  Diagnosis Date  . Arthritis   . Asthma   . Hypothyroidism 1997  . Insomnia   . Palpitations   . Varicose veins of bilateral lower extremities with other complications    Past Surgical History:  Procedure Laterality Date  . EP IMPLANTABLE DEVICE N/A 05/12/2016   Procedure: Loop Recorder Insertion;  Surgeon: Thompson Grayer, MD;  Location: [facility];  Service: Cardiovascular;  Laterality: N/A;  . KNEE SURGERY      ROS- all systems are reviewed and negatives except as per HPI above  Current Outpatient Medications  Medication Sig Dispense Refill  . ADVAIR HFA 115-21 MCG/ACT inhaler Inhale into the lungs as directed.     Marland Kitchen albuterol (PROVENTIL HFA;VENTOLIN HFA) 108 (90 Base) MCG/ACT inhaler Inhale 2 puffs into the lungs every 4 (four) hours as needed for wheezing or shortness of breath.    . clindamycin (CLEOCIN) 150 MG capsule Take 150 mg by mouth as directed. TAKE 4 CAPSULE 30-90 MINUTES BEFORE ANY PROCEDURE (Dental, Surgeries, etc...)    . diclofenac sodium (VOLTAREN) 1 % GEL Apply 1 application topically daily as needed (knee pain).    Marland Kitchen diltiazem (CARDIZEM) 30 MG tablet Take 1 tablet (30 mg total) by mouth daily as needed (palpitations). 30 tablet 6  . fluticasone (FLONASE) 50 MCG/ACT nasal spray Place 2 sprays into both nostrils daily.    . Hypertonic Nasal Wash (SINUS RINSE NA) Place 1 applicator into the nose every other day.    . levothyroxine (SYNTHROID, LEVOTHROID) 112 MCG tablet Take 1 tablet by mouth daily.    . Melatonin 5 MG TABS Take 5 mg  by mouth at bedtime.     . Methylcellulose, Laxative, (CITRUCEL) 500 MG TABS Take 1 tablet by mouth at bedtime.    . metoprolol tartrate (LOPRESSOR) 25 MG tablet Take 0.5 tablets (12.5 mg total) by mouth 2 (two) times daily. (Patient taking differently: Take 12.5 mg by mouth daily as needed. ) 90 tablet 3  . Multiple Minerals-Vitamins (CALCIUM CITRATE-MAG-MINERALS) TABS Take 1 tablet by mouth daily.     . valACYclovir (VALTREX) 1000 MG tablet as directed.      No current facility-administered medications for this visit.     Physical Exam: Vitals:   03/21/18 1629  BP: 130/76  Pulse: 80  SpO2: 97%  Weight: 186 lb 12.8 oz (84.7 kg)  Height: 5\' 5"  (1.651 m)    GEN- The patient is well appearing, alert and oriented x 3 today.   Head- normocephalic, atraumatic Eyes-  Sclera clear, conjunctiva pink Ears- hearing intact Oropharynx- clear Lungs- Clear to ausculation bilaterally, normal work of breathing Heart- Regular rate and rhythm, no murmurs, rubs or gallops, PMI not laterally displaced GI- soft, NT, ND, + BS Extremities- no clubbing, cyanosis, or edema  Wt Readings from Last 3 Encounters:  03/21/18 186 lb 12.8 oz (84.7 kg)  02/01/17 200 lb 12.8 oz (91.1 kg)  10/26/16 199 lb (90.3 kg)    EKG tracing ordered today is personally reviewed and shows sinus rhythm  Assessment and Plan:  1. SVT  Continues to have occasional episodes She continues to decline ablation  2. HTN Stable No change required today  Carelink Return to see EP PA every year  Thompson Grayer MD, [facility] 03/21/2018 4:38 PM

## 2018-03-21 NOTE — Patient Instructions (Addendum)
Medication Instructions:  Your physician recommends that you continue on your current medications as directed. Please refer to the Current Medication list given to you today.  Labwork: None ordered.  Testing/Procedures: None ordered.  Follow-Up: Your physician wants you to follow-up in: one year with Renee Ursuy PA.  You will receive a reminder letter in the mail two months in advance. If you don't receive a letter, please call our office to schedule the follow-up appointment.  Any Other Special Instructions Will Be Listed Below (If Applicable).  If you need a refill on your cardiac medications before your next appointment, please call your pharmacy.   

## 2018-03-22 NOTE — Addendum Note (Signed)
Addended by: Rose Phi on: 03/22/2018 05:13 PM   Modules accepted: Orders

## 2018-03-30 ENCOUNTER — Ambulatory Visit (INDEPENDENT_AMBULATORY_CARE_PROVIDER_SITE_OTHER): Payer: Medicare Other

## 2018-03-30 DIAGNOSIS — R002 Palpitations: Secondary | ICD-10-CM | POA: Diagnosis not present

## 2018-04-04 NOTE — Progress Notes (Signed)
Carelink Summary Report / Loop Recorder 

## 2018-04-07 ENCOUNTER — Other Ambulatory Visit: Payer: Self-pay | Admitting: Family Medicine

## 2018-04-07 DIAGNOSIS — Z1231 Encounter for screening mammogram for malignant neoplasm of breast: Secondary | ICD-10-CM

## 2018-04-21 DIAGNOSIS — E039 Hypothyroidism, unspecified: Secondary | ICD-10-CM | POA: Diagnosis not present

## 2018-05-02 ENCOUNTER — Ambulatory Visit: Payer: Medicare Other

## 2018-05-02 DIAGNOSIS — R002 Palpitations: Secondary | ICD-10-CM

## 2018-05-02 NOTE — Progress Notes (Signed)
Carelink Summary Report / Loop Recorder 

## 2018-05-03 LAB — CUP PACEART REMOTE DEVICE CHECK
Date Time Interrogation Session: 20191231010603
MDC IDC PG IMPLANT DT: 20180109

## 2018-05-15 LAB — CUP PACEART REMOTE DEVICE CHECK
Implantable Pulse Generator Implant Date: 20180109
MDC IDC SESS DTM: 20191128003928

## 2018-05-17 ENCOUNTER — Ambulatory Visit
Admission: RE | Admit: 2018-05-17 | Discharge: 2018-05-17 | Disposition: A | Discharge status: Home / Self Care | Payer: Medicare Other | Source: Ambulatory Visit | Attending: Family Medicine | Admitting: Family Medicine

## 2018-05-17 DIAGNOSIS — Z1231 Encounter for screening mammogram for malignant neoplasm of breast: Secondary | ICD-10-CM | POA: Diagnosis not present

## 2018-06-06 ENCOUNTER — Ambulatory Visit (INDEPENDENT_AMBULATORY_CARE_PROVIDER_SITE_OTHER): Payer: Medicare Other

## 2018-06-06 DIAGNOSIS — R002 Palpitations: Secondary | ICD-10-CM

## 2018-06-08 LAB — CUP PACEART REMOTE DEVICE CHECK
Date Time Interrogation Session: 20200202010521
Implantable Pulse Generator Implant Date: 20180109

## 2018-06-10 ENCOUNTER — Telehealth: Payer: Self-pay | Admitting: Cardiology

## 2018-06-10 NOTE — Telephone Encounter (Signed)
Spoke w/ pt and requested that she send a manual transmission /w her home monitor. Pt stated that she would.

## 2018-06-10 NOTE — Telephone Encounter (Signed)
Manual transmission received and reviewed. 8 "tachy" episodes noted from 06/07/18 between 18:42-19:25, ECGs appear SVT hovering at tachy detection rate. Per Dr. Jackalyn Lombard OV note from 03/21/18, patient continues to decline ablation. ECGs printed and placed in Dr. Otilio Connors folder for review.

## 2018-06-14 DIAGNOSIS — E039 Hypothyroidism, unspecified: Secondary | ICD-10-CM | POA: Diagnosis not present

## 2018-06-15 NOTE — Progress Notes (Signed)
Carelink Summary Report / Loop Recorder 

## 2018-06-20 ENCOUNTER — Other Ambulatory Visit: Payer: Self-pay | Admitting: Internal Medicine

## 2018-07-07 ENCOUNTER — Ambulatory Visit (INDEPENDENT_AMBULATORY_CARE_PROVIDER_SITE_OTHER): Payer: Medicare Other | Admitting: *Deleted

## 2018-07-07 DIAGNOSIS — R002 Palpitations: Secondary | ICD-10-CM

## 2018-07-08 LAB — CUP PACEART REMOTE DEVICE CHECK
Implantable Pulse Generator Implant Date: 20180109
MDC IDC SESS DTM: 20200306011006

## 2018-07-18 NOTE — Progress Notes (Signed)
Carelink Summary Report / Loop Recorder 

## 2018-08-09 ENCOUNTER — Other Ambulatory Visit: Payer: Self-pay

## 2018-08-09 ENCOUNTER — Ambulatory Visit (INDEPENDENT_AMBULATORY_CARE_PROVIDER_SITE_OTHER): Payer: Medicare Other | Admitting: *Deleted

## 2018-08-09 DIAGNOSIS — R002 Palpitations: Secondary | ICD-10-CM

## 2018-08-10 LAB — CUP PACEART REMOTE DEVICE CHECK
Date Time Interrogation Session: 20200408013627
Implantable Pulse Generator Implant Date: 20180109

## 2018-08-17 NOTE — Progress Notes (Signed)
Carelink Summary Report / Loop Recorder 

## 2018-09-12 ENCOUNTER — Ambulatory Visit (INDEPENDENT_AMBULATORY_CARE_PROVIDER_SITE_OTHER): Payer: Medicare Other | Admitting: *Deleted

## 2018-09-12 ENCOUNTER — Other Ambulatory Visit: Payer: Self-pay

## 2018-09-12 DIAGNOSIS — R002 Palpitations: Secondary | ICD-10-CM

## 2018-09-12 DIAGNOSIS — I471 Supraventricular tachycardia: Secondary | ICD-10-CM

## 2018-09-12 LAB — CUP PACEART REMOTE DEVICE CHECK
Date Time Interrogation Session: 20200511014043
Implantable Pulse Generator Implant Date: 20180109

## 2018-09-23 NOTE — Progress Notes (Signed)
Carelink Summary Report / Loop Recorder 

## 2018-10-14 ENCOUNTER — Ambulatory Visit (INDEPENDENT_AMBULATORY_CARE_PROVIDER_SITE_OTHER): Payer: Medicare Other | Admitting: *Deleted

## 2018-10-14 DIAGNOSIS — R002 Palpitations: Secondary | ICD-10-CM

## 2018-10-17 LAB — CUP PACEART REMOTE DEVICE CHECK
Date Time Interrogation Session: 20200613021152
Implantable Pulse Generator Implant Date: 20180109

## 2018-10-18 NOTE — Progress Notes (Signed)
Carelink Summary Report / Loop Recorder 

## 2018-10-21 ENCOUNTER — Telehealth: Payer: Self-pay | Admitting: *Deleted

## 2018-10-21 NOTE — Telephone Encounter (Signed)
Spoke with patient regarding LINQ alert for 13 "tachy" episodes, available ECG shows likely SVT on 10/07/18 at 10:57, duration 52sec. Requested manual transmission for review of remaining episodes. Appears monitor was disconnected for a short time.  Patient denies any recent palpitations or awareness of SVT episodes. She reports that they lost power a few weeks ago and she cleaned a few days ago and noticed the monitor was disconnected. She agrees to send a manual transmission for review and denies questions or concerns at this time.

## 2018-10-21 NOTE — Telephone Encounter (Signed)
Manual transmission received. 13 "tachy" episodes are from 10/07/18, appear SVT, similar to previous episodes.

## 2018-11-02 DIAGNOSIS — R110 Nausea: Secondary | ICD-10-CM | POA: Diagnosis not present

## 2018-11-15 DIAGNOSIS — N183 Chronic kidney disease, stage 3 (moderate): Secondary | ICD-10-CM | POA: Diagnosis not present

## 2018-11-15 DIAGNOSIS — E785 Hyperlipidemia, unspecified: Secondary | ICD-10-CM | POA: Diagnosis not present

## 2018-11-15 DIAGNOSIS — Z1211 Encounter for screening for malignant neoplasm of colon: Secondary | ICD-10-CM | POA: Diagnosis not present

## 2018-11-15 DIAGNOSIS — J4530 Mild persistent asthma, uncomplicated: Secondary | ICD-10-CM | POA: Diagnosis not present

## 2018-11-15 DIAGNOSIS — E039 Hypothyroidism, unspecified: Secondary | ICD-10-CM | POA: Diagnosis not present

## 2018-11-15 DIAGNOSIS — B001 Herpesviral vesicular dermatitis: Secondary | ICD-10-CM | POA: Diagnosis not present

## 2018-11-15 DIAGNOSIS — I471 Supraventricular tachycardia: Secondary | ICD-10-CM | POA: Diagnosis not present

## 2018-11-15 DIAGNOSIS — Z0000 Encounter for general adult medical examination without abnormal findings: Secondary | ICD-10-CM | POA: Diagnosis not present

## 2018-11-15 DIAGNOSIS — Z5181 Encounter for therapeutic drug level monitoring: Secondary | ICD-10-CM | POA: Diagnosis not present

## 2018-11-16 ENCOUNTER — Ambulatory Visit (INDEPENDENT_AMBULATORY_CARE_PROVIDER_SITE_OTHER): Payer: Medicare Other | Admitting: *Deleted

## 2018-11-16 DIAGNOSIS — R002 Palpitations: Secondary | ICD-10-CM | POA: Diagnosis not present

## 2018-11-17 DIAGNOSIS — E039 Hypothyroidism, unspecified: Secondary | ICD-10-CM | POA: Diagnosis not present

## 2018-11-17 DIAGNOSIS — E785 Hyperlipidemia, unspecified: Secondary | ICD-10-CM | POA: Diagnosis not present

## 2018-11-17 DIAGNOSIS — Z5181 Encounter for therapeutic drug level monitoring: Secondary | ICD-10-CM | POA: Diagnosis not present

## 2018-11-17 DIAGNOSIS — Z1211 Encounter for screening for malignant neoplasm of colon: Secondary | ICD-10-CM | POA: Diagnosis not present

## 2018-11-17 LAB — CUP PACEART REMOTE DEVICE CHECK
Date Time Interrogation Session: 20200716024153
Implantable Pulse Generator Implant Date: 20180109

## 2018-11-26 NOTE — Progress Notes (Signed)
Carelink Summary Report / Loop Recorder 

## 2018-12-19 ENCOUNTER — Ambulatory Visit (INDEPENDENT_AMBULATORY_CARE_PROVIDER_SITE_OTHER): Payer: Medicare Other | Admitting: *Deleted

## 2018-12-19 DIAGNOSIS — R55 Syncope and collapse: Secondary | ICD-10-CM

## 2018-12-20 LAB — CUP PACEART REMOTE DEVICE CHECK
Date Time Interrogation Session: 20200818024139
Implantable Pulse Generator Implant Date: 20180109

## 2018-12-27 NOTE — Progress Notes (Signed)
Carelink Summary Report / Loop Recorder 

## 2019-01-12 ENCOUNTER — Telehealth: Payer: Self-pay | Admitting: Emergency Medicine

## 2019-01-12 NOTE — Telephone Encounter (Signed)
LMOM for patient to send manual transmission due to SVT episode on LINQ transmission. Patient to call office if she has any issues.

## 2019-01-16 NOTE — Telephone Encounter (Signed)
Transmission received 01-12-2019

## 2019-01-16 NOTE — Telephone Encounter (Addendum)
Manual transmission reviewed--19 tachy episodes detected on 01/01/19 between 13:05-15:33, V rates 176-194bpm. Episode termination is noted at 15:44. ECGs consistent with previous episodes (NCT), though initial onset shows wider complex, see below for onset/termination ECGs. Pt declined ablation at 03/21/18 f/u with Dr. Rayann Heman per notes. F/U with EP APP due in 03/2019 per recall (letter sent).  Spoke with patient. Her brother was visiting from [place]. She was deep in discussion at time of episodes on 8/30, felt her heart "catch", made her take a deep breath, then noticed that her heart started racing. She sat down for a few min to wait for symptoms to pass. Denies dizziness. Felt tired after. Doesn't feel like it lasted as long as previous episodes so she did not try taking PRN diltiazem or metoprolol tartrate. Reviewed PRN medication use for SVT episodes. Advised to make [place] aware of any new symptomatic episodes. Pt aware to let us know if she reconsiders ablation. No further questions at this time.  Routed to Dr. Rayann Heman for review of strips.

## 2019-01-23 ENCOUNTER — Ambulatory Visit (INDEPENDENT_AMBULATORY_CARE_PROVIDER_SITE_OTHER): Payer: Medicare Other | Admitting: *Deleted

## 2019-01-23 DIAGNOSIS — R002 Palpitations: Secondary | ICD-10-CM | POA: Diagnosis not present

## 2019-01-23 LAB — CUP PACEART REMOTE DEVICE CHECK
Date Time Interrogation Session: 20200920101136
Implantable Pulse Generator Implant Date: 20180109

## 2019-01-27 ENCOUNTER — Other Ambulatory Visit: Payer: Self-pay | Admitting: Nurse Practitioner

## 2019-01-31 NOTE — Progress Notes (Signed)
Carelink Summary Report / Loop Recorder 

## 2019-02-06 ENCOUNTER — Telehealth: Payer: Self-pay | Admitting: Internal Medicine

## 2019-02-06 NOTE — Telephone Encounter (Signed)
Spoke with patient. Explained that her loop recorder and heart rhythm will not cause swelling. Pt reports swelling has been worse over the past week or so, feels like it's fluid but it's localized mostly to her upper right thigh. No pain or discoloration. She scheduled an appointment with her orthopedic MD for Thursday, but wanted to check with [place], as well. Encouraged pt to contact PCP for additional recommendations. Pt verbalizes understanding and agrees to contact PCP. No further questions at this time.

## 2019-02-06 NOTE — Telephone Encounter (Signed)
New message     Pt has a loop recorder.  She states that her rt leg below the knee up toward her hip is swollen.  Could this be coming from the loop recorder?  Please advise

## 2019-02-07 DIAGNOSIS — Z23 Encounter for immunization: Secondary | ICD-10-CM | POA: Diagnosis not present

## 2019-02-07 DIAGNOSIS — I83813 Varicose veins of bilateral lower extremities with pain: Secondary | ICD-10-CM | POA: Diagnosis not present

## 2019-02-09 DIAGNOSIS — M25561 Pain in right knee: Secondary | ICD-10-CM | POA: Diagnosis not present

## 2019-02-09 DIAGNOSIS — Z96652 Presence of left artificial knee joint: Secondary | ICD-10-CM | POA: Diagnosis not present

## 2019-02-09 DIAGNOSIS — M25562 Pain in left knee: Secondary | ICD-10-CM | POA: Diagnosis not present

## 2019-02-09 DIAGNOSIS — Z96651 Presence of right artificial knee joint: Secondary | ICD-10-CM | POA: Diagnosis not present

## 2019-02-24 ENCOUNTER — Ambulatory Visit (INDEPENDENT_AMBULATORY_CARE_PROVIDER_SITE_OTHER): Payer: Medicare Other | Admitting: *Deleted

## 2019-02-24 DIAGNOSIS — I471 Supraventricular tachycardia: Secondary | ICD-10-CM | POA: Diagnosis not present

## 2019-02-26 LAB — CUP PACEART REMOTE DEVICE CHECK
Date Time Interrogation Session: 20201023101059
Implantable Pulse Generator Implant Date: 20180109

## 2019-03-06 NOTE — Progress Notes (Signed)
Carelink Summary Report / Loop Recorder 

## 2019-03-14 DIAGNOSIS — G2581 Restless legs syndrome: Secondary | ICD-10-CM | POA: Diagnosis not present

## 2019-03-14 DIAGNOSIS — R600 Localized edema: Secondary | ICD-10-CM | POA: Diagnosis not present

## 2019-03-14 DIAGNOSIS — I83893 Varicose veins of bilateral lower extremities with other complications: Secondary | ICD-10-CM | POA: Diagnosis not present

## 2019-03-14 DIAGNOSIS — I83813 Varicose veins of bilateral lower extremities with pain: Secondary | ICD-10-CM | POA: Diagnosis not present

## 2019-03-16 DIAGNOSIS — I83813 Varicose veins of bilateral lower extremities with pain: Secondary | ICD-10-CM | POA: Diagnosis not present

## 2019-03-16 DIAGNOSIS — I83893 Varicose veins of bilateral lower extremities with other complications: Secondary | ICD-10-CM | POA: Diagnosis not present

## 2019-03-16 DIAGNOSIS — R600 Localized edema: Secondary | ICD-10-CM | POA: Diagnosis not present

## 2019-03-16 DIAGNOSIS — G2581 Restless legs syndrome: Secondary | ICD-10-CM | POA: Diagnosis not present

## 2019-03-19 NOTE — Progress Notes (Deleted)
Cardiology Office Note Date:  03/19/2019  Patient ID:  Kendra Russell, Kendra Russell 1950/05/10, MRN [id number] PCP:  Maurice Small, MD  Electrophysiologist:  Dr. Rayann Heman  ***refresh   Chief Complaint: *** annual visit  History of Present Illness: Kendra Russell is a 68 y.o. female with history of asthma, hypothyroidism, HTN,, b/l varicose veins, palpitations > loop > svt.  She comes in today to be seen for Dr. Rayann Heman, last seen by him Nov 2019.  At that time doing well, noting occassional episodes of SVT, pt continued to decline ablation, planned for annual APP visits.  *** symptoms *** meds *** SVTs?? *** labs, lipids, ....   Device information MDT ILR, implanted 05/12/2016, palpitations   Past Medical History:  Diagnosis Date  . Arthritis   . Asthma   . Hypothyroidism 1997  . Insomnia   . Palpitations   . Varicose veins of bilateral lower extremities with other complications     Past Surgical History:  Procedure Laterality Date  . EP IMPLANTABLE DEVICE N/A 05/12/2016   Procedure: Loop Recorder Insertion;  Surgeon: Thompson Grayer, MD;  Location: [facility];  Service: Cardiovascular;  Laterality: N/A;  . KNEE SURGERY      Current Outpatient Medications  Medication Sig Dispense Refill  . ADVAIR HFA 115-21 MCG/ACT inhaler Inhale into the lungs as directed.     Marland Kitchen albuterol (PROVENTIL HFA;VENTOLIN HFA) 108 (90 Base) MCG/ACT inhaler Inhale 2 puffs into the lungs every 4 (four) hours as needed for wheezing or shortness of breath.    . clindamycin (CLEOCIN) 150 MG capsule Take 150 mg by mouth as directed. TAKE 4 CAPSULE 30-90 MINUTES BEFORE ANY PROCEDURE (Dental, Surgeries, etc...)    . diclofenac sodium (VOLTAREN) 1 % GEL Apply 1 application topically daily as needed (knee pain).    Marland Kitchen diltiazem (CARDIZEM) 30 MG tablet Take 1 tablet by mouth daily as needed for palpitations. Please make yearly appt with Dr.Allred for November for future refills. 1st attempt 30  tablet 1  . fluticasone (FLONASE) 50 MCG/ACT nasal spray Place 2 sprays into both nostrils daily.    . Hypertonic Nasal Wash (SINUS RINSE NA) Place 1 applicator into the nose every other day.    . levothyroxine (SYNTHROID, LEVOTHROID) 112 MCG tablet Take 1 tablet by mouth daily.    . Melatonin 5 MG TABS Take 5 mg by mouth at bedtime.     . Methylcellulose, Laxative, (CITRUCEL) 500 MG TABS Take 1 tablet by mouth at bedtime.    . metoprolol tartrate (LOPRESSOR) 25 MG tablet Take 0.5 tablets (12.5 mg total) by mouth 2 (two) times daily. (Patient taking differently: Take 12.5 mg by mouth daily as needed. ) 90 tablet 3  . Multiple Minerals-Vitamins (CALCIUM CITRATE-MAG-MINERALS) TABS Take 1 tablet by mouth daily.     . valACYclovir (VALTREX) 1000 MG tablet as directed.      No current facility-administered medications for this visit.     Allergies:   Other, Penicillins, Pepcid [famotidine], and Sulfa antibiotics   Social History:  The patient  reports that she has never smoked. She has never used smokeless tobacco. She reports that she does not drink alcohol or use drugs.   Family History:  The patient's family history includes Atrial fibrillation in her mother and sister; Breast cancer in her cousin.  ROS:  Please see the history of present illness.  All other systems are reviewed and otherwise negative.   PHYSICAL EXAM: *** VS:  There were no vitals  taken for this visit. BMI: There is no height or weight on file to calculate BMI. Well nourished, well developed, in no acute distress  HEENT: normocephalic, atraumatic  Neck: no JVD, carotid bruits or masses Cardiac:  *** RRR; no significant murmurs, no rubs, or gallops Lungs:  *** CTA b/l, no wheezing, rhonchi or rales  Abd: soft, nontender MS: no deformity or *** atrophy Ext: *** no edema  Skin: warm and dry, no rash Neuro:  No gross deficits appreciated Psych: euthymic mood, full affect  *** ILR site is stable, no tethering or  discomfort   EKG:  Done today and reviewed by myself shows *** ILR interrogation done today and reviewed by myself: ***   04/29/16: Lexiscan myoview  The left ventricular ejection fraction is hyperdynamic (>65%).  Nuclear stress EF: 68%.  There was no ST segment deviation noted during stress.  The study is normal.  This is a low risk study.   Normal pharmacologic nuclear stress test with no evidence for prior infarct of ischemia.   04/20/16: TTE Study Conclusions - Left ventricle: The cavity size was normal. Wall thickness was   normal. Systolic function was vigorous. The estimated ejection   fraction was in the range of 65% to 70%. Wall motion was normal;   there were no regional wall motion abnormalities. Doppler   parameters are consistent with abnormal left ventricular   relaxation (grade 1 diastolic dysfunction). The E/e&' ratio is   between 8-15, suggesting indeterminate LV filling pressure. - Left atrium: The atrium was normal in size. - Right atrium: The atrium was normal in size. - Inferior vena cava: The vessel was normal in size. The   respirophasic diameter changes were in the normal range (>= 50%),   consistent with normal central venous pressure.  Impressions: - LVEF 65-70%, normal wall thickness, normal wall motion, diastolic   dysfunction, indeterminate LV filling pressure, normal biatrial   size, normal IVC.  Recent Labs: No results found for requested labs within last 8760 hours.  No results found for requested labs within last 8760 hours.   CrCl cannot be calculated (Patient's most recent lab result is older than the maximum 21 days allowed.).   Wt Readings from Last 3 Encounters:  03/21/18 186 lb 12.8 oz (84.7 kg)  02/01/17 200 lb 12.8 oz (91.1 kg)  10/26/16 199 lb (90.3 kg)     Other studies reviewed: Additional studies/records reviewed today include: summarized above  ASSESSMENT AND PLAN:  1. SVT     ***  2. HTN     ***   Disposition: F/u with ***  Current medicines are reviewed at length with the patient today.  The patient did not have any concerns regarding medicines.***  Signed, Tommye Standard, PA-C 03/19/2019 8:07 AM     [facility] [address] [place] [address] [phone] (office)  [phone] (fax)

## 2019-03-21 ENCOUNTER — Encounter: Payer: Medicare Other | Admitting: Physician Assistant

## 2019-03-23 DIAGNOSIS — M720 Palmar fascial fibromatosis [Dupuytren]: Secondary | ICD-10-CM | POA: Diagnosis not present

## 2019-03-29 ENCOUNTER — Ambulatory Visit (INDEPENDENT_AMBULATORY_CARE_PROVIDER_SITE_OTHER): Payer: Medicare Other | Admitting: *Deleted

## 2019-03-29 DIAGNOSIS — I471 Supraventricular tachycardia: Secondary | ICD-10-CM | POA: Diagnosis not present

## 2019-03-29 LAB — CUP PACEART REMOTE DEVICE CHECK
Date Time Interrogation Session: 20201125051255
Implantable Pulse Generator Implant Date: 20180109

## 2019-04-03 NOTE — Progress Notes (Deleted)
Cardiology Office Note Date:  04/03/2019  Patient ID:  [facility] Kendra, Russell 1951-01-16, MRN [id number] PCP:  Maurice Small, MD  Electrophysiologist:  Dr. Rayann Heman  ***refresh   Chief Complaint: *** annual visit  History of Present Illness: Kendra Russell is a 68 y.o. female with history of asthma, hypothyroidism, HTN,, b/l varicose veins, palpitations > loop > svt.  She comes in today to be seen for Dr. Rayann Heman, last seen by him Nov 2019.  At that time doing well, noting occassional episodes of SVT, pt continued to decline ablation, planned for annual APP visits.  *** symptoms *** meds *** SVTs?? *** labs, lipids, ....   Device information MDT ILR, implanted 05/12/2016, palpitations   Past Medical History:  Diagnosis Date  . Arthritis   . Asthma   . Hypothyroidism 1997  . Insomnia   . Palpitations   . Varicose veins of bilateral lower extremities with other complications     Past Surgical History:  Procedure Laterality Date  . EP IMPLANTABLE DEVICE N/A 05/12/2016   Procedure: Loop Recorder Insertion;  Surgeon: Thompson Grayer, MD;  Location: [place] [facility];  Service: Cardiovascular;  Laterality: N/A;  . KNEE SURGERY      Current Outpatient Medications  Medication Sig Dispense Refill  . ADVAIR HFA 115-21 MCG/ACT inhaler Inhale into the lungs as directed.     Marland Kitchen albuterol (PROVENTIL HFA;VENTOLIN HFA) 108 (90 Base) MCG/ACT inhaler Inhale 2 puffs into the lungs every 4 (four) hours as needed for wheezing or shortness of breath.    . clindamycin (CLEOCIN) 150 MG capsule Take 150 mg by mouth as directed. TAKE 4 CAPSULE 30-90 MINUTES BEFORE ANY PROCEDURE (Dental, Surgeries, etc...)    . diclofenac sodium (VOLTAREN) 1 % GEL Apply 1 application topically daily as needed (knee pain).    Marland Kitchen diltiazem (CARDIZEM) 30 MG tablet Take 1 tablet by mouth daily as needed for palpitations. Please make yearly appt with Dr.Allred for November for future refills. 1st attempt 30  tablet 1  . fluticasone (FLONASE) 50 MCG/ACT nasal spray Place 2 sprays into both nostrils daily.    . Hypertonic Nasal Wash (SINUS RINSE NA) Place 1 applicator into the nose every other day.    . levothyroxine (SYNTHROID, LEVOTHROID) 112 MCG tablet Take 1 tablet by mouth daily.    . Melatonin 5 MG TABS Take 5 mg by mouth at bedtime.     . Methylcellulose, Laxative, (CITRUCEL) 500 MG TABS Take 1 tablet by mouth at bedtime.    . metoprolol tartrate (LOPRESSOR) 25 MG tablet Take 0.5 tablets (12.5 mg total) by mouth 2 (two) times daily. (Patient taking differently: Take 12.5 mg by mouth daily as needed. ) 90 tablet 3  . Multiple Minerals-Vitamins (CALCIUM CITRATE-MAG-MINERALS) TABS Take 1 tablet by mouth daily.     . valACYclovir (VALTREX) 1000 MG tablet as directed.      No current facility-administered medications for this visit.     Allergies:   Other, Penicillins, Pepcid [famotidine], and Sulfa antibiotics   Social History:  The patient  reports that she has never smoked. She has never used smokeless tobacco. She reports that she does not drink alcohol or use drugs.   Family History:  The patient's family history includes Atrial fibrillation in her mother and sister; Breast cancer in her cousin.  ROS:  Please see the history of present illness.  All other systems are reviewed and otherwise negative.   PHYSICAL EXAM: *** VS:  There were no vitals  taken for this visit. BMI: There is no height or weight on file to calculate BMI. Well nourished, well developed, in no acute distress  HEENT: normocephalic, atraumatic  Neck: no JVD, carotid bruits or masses Cardiac:  *** RRR; no significant murmurs, no rubs, or gallops Lungs:  *** CTA b/l, no wheezing, rhonchi or rales  Abd: soft, nontender MS: no deformity or *** atrophy Ext: *** no edema  Skin: warm and dry, no rash Neuro:  No gross deficits appreciated Psych: euthymic mood, full affect  *** ILR site is stable, no tethering or  discomfort   EKG:  Done today and reviewed by myself shows *** ILR interrogation done today and reviewed by myself: ***   04/29/16: Lexiscan myoview  The left ventricular ejection fraction is hyperdynamic (>65%).  Nuclear stress EF: 68%.  There was no ST segment deviation noted during stress.  The study is normal.  This is a low risk study.   Normal pharmacologic nuclear stress test with no evidence for prior infarct of ischemia.   04/20/16: TTE Study Conclusions - Left ventricle: The cavity size was normal. Wall thickness was   normal. Systolic function was vigorous. The estimated ejection   fraction was in the range of 65% to 70%. Wall motion was normal;   there were no regional wall motion abnormalities. Doppler   parameters are consistent with abnormal left ventricular   relaxation (grade 1 diastolic dysfunction). The E/e&' ratio is   between 8-15, suggesting indeterminate LV filling pressure. - Left atrium: The atrium was normal in size. - Right atrium: The atrium was normal in size. - Inferior vena cava: The vessel was normal in size. The   respirophasic diameter changes were in the normal range (>= 50%),   consistent with normal central venous pressure.  Impressions: - LVEF 65-70%, normal wall thickness, normal wall motion, diastolic   dysfunction, indeterminate LV filling pressure, normal biatrial   size, normal IVC.  Recent Labs: No results found for requested labs within last 8760 hours.  No results found for requested labs within last 8760 hours.   CrCl cannot be calculated (Patient's most recent lab result is older than the maximum 21 days allowed.).   Wt Readings from Last 3 Encounters:  03/21/18 186 lb 12.8 oz (84.7 kg)  02/01/17 200 lb 12.8 oz (91.1 kg)  10/26/16 199 lb (90.3 kg)     Other studies reviewed: Additional studies/records reviewed today include: summarized above  ASSESSMENT AND PLAN:  1. SVT     ***  2. HTN     ***   Disposition: F/u with ***  Current medicines are reviewed at length with the patient today.  The patient did not have any concerns regarding medicines.***  Signed, Tommye Standard, PA-C 04/03/2019 1:27 PM     [place] [address] [phone] (office)  [phone] (fax)

## 2019-04-05 ENCOUNTER — Encounter: Payer: Medicare Other | Admitting: Physician Assistant

## 2019-04-07 ENCOUNTER — Ambulatory Visit (INDEPENDENT_AMBULATORY_CARE_PROVIDER_SITE_OTHER): Payer: Medicare Other | Admitting: Physician Assistant

## 2019-04-07 ENCOUNTER — Other Ambulatory Visit: Payer: Self-pay

## 2019-04-07 VITALS — BP 166/80 | HR 64 | Ht 65.0 in | Wt 188.0 lb

## 2019-04-07 DIAGNOSIS — I471 Supraventricular tachycardia, unspecified: Secondary | ICD-10-CM

## 2019-04-07 DIAGNOSIS — Z4509 Encounter for adjustment and management of other cardiac device: Secondary | ICD-10-CM

## 2019-04-07 DIAGNOSIS — I10 Essential (primary) hypertension: Secondary | ICD-10-CM | POA: Diagnosis not present

## 2019-04-07 NOTE — Progress Notes (Signed)
Cardiology Office Note Date:  04/07/2019  Patient ID:  [facility] Erich, Fruge [birth year], MRN [id number] PCP:  Maurice Small, MD  Electrophysiologist:  Dr. Rayann Heman     Chief Complaint:  annual visit  History of Present Illness: Kendra Russell is a 68 y.o. female with history of asthma, hypothyroidism, HTN,, b/l varicose veins, palpitations > loop > svt.  She comes in today to be seen for Dr. Rayann Heman, last seen by him Nov 2019.  At that time doing well, noting occassional episodes of SVT, pt continued to decline ablation, planned for annual APP visits.  She all in all is doing OK.  She is caring for her Mom (now living with her) who is still somewhat independent though needs som help.  She also helps her husband (who is mostly independent though no longer drives since his stroke some years ago, and requires some help from her as well)  She has had no CP or SOB, no exertional intolerances.  No dizzy spells, near syncope or syncope. She has had some SVT [isodes, 2 for sure She has learned that if she does not sleep well, and fatigued she is more apt to have a tachycardia, one happened after her grand daughter slipped fell in her home, this made her aware of a leak in her attic into her home, and was a very stressful night, slept very poorly and the following day had an SVT. Once a couple days after Thanksgiving she was eating fast and this set one off.    She is not unhappy with the frequency of her tachycardias, she is aware of them, they make her feel fatigued.  She can usually just stop, take some long deep breaths and it will settle, if this does not work in a few minutes she will take a metoprolol lay down and it will eventually stop. She tells me she has never taken a PRN diltiazem but has them, she does not take the metoprolol except when she has a tachycardia, says they make her feel to tired, does not like them. She also mentions she tends to be sensitive to medicines.   She was recently started on Meloxican for her arthritis, pain/swelling in one of her fingers especially and makes her feel swollen/puffy, under her eyes especially   Device information MDT ILR, implanted 05/12/2016, palpitations   Past Medical History:  Diagnosis Date  . Arthritis   . Asthma   . Hypothyroidism 1997  . Insomnia   . Palpitations   . Varicose veins of bilateral lower extremities with other complications     Past Surgical History:  Procedure Laterality Date  . EP IMPLANTABLE DEVICE N/A 05/12/2016   Procedure: Loop Recorder Insertion;  Surgeon: Thompson Grayer, MD;  Location: [place] [facility];  Service: Cardiovascular;  Laterality: N/A;  . KNEE SURGERY      Current Outpatient Medications  Medication Sig Dispense Refill  . ADVAIR HFA 115-21 MCG/ACT inhaler Inhale into the lungs as directed.     Marland Kitchen albuterol (PROVENTIL HFA;VENTOLIN HFA) 108 (90 Base) MCG/ACT inhaler Inhale 2 puffs into the lungs every 4 (four) hours as needed for wheezing or shortness of breath.    . clindamycin (CLEOCIN) 150 MG capsule Take 150 mg by mouth as directed. TAKE 4 CAPSULE 30-90 MINUTES BEFORE ANY PROCEDURE (Dental, Surgeries, etc...)    . diclofenac sodium (VOLTAREN) 1 % GEL Apply 1 application topically daily as needed (knee pain).    Marland Kitchen diltiazem (CARDIZEM) 30 MG tablet Take  1 tablet by mouth daily as needed for palpitations. Please make yearly appt with Dr.Allred for November for future refills. 1st attempt 30 tablet 1  . fluticasone (FLONASE) 50 MCG/ACT nasal spray Place 2 sprays into both nostrils daily.    . Hypertonic Nasal Wash (SINUS RINSE NA) Place 1 applicator into the nose every other day.    . levothyroxine (SYNTHROID) 100 MCG tablet Take 100 mcg by mouth daily.    . Melatonin 5 MG TABS Take 5 mg by mouth at bedtime.     . Methylcellulose, Laxative, (CITRUCEL) 500 MG TABS Take 1 tablet by mouth at bedtime.    . metoprolol tartrate (LOPRESSOR) 25 MG tablet Take 0.5 tablets (12.5 mg  total) by mouth 2 (two) times daily. (Patient taking differently: Take 12.5 mg by mouth daily as needed. ) 90 tablet 3  . MOBIC 7.5 MG tablet Take 7.5 mg by mouth daily.    . Multiple Minerals-Vitamins (CALCIUM CITRATE-MAG-MINERALS) TABS Take 1 tablet by mouth daily.     Marland Kitchen omeprazole (PRILOSEC) 40 MG capsule Take 40 mg by mouth daily.    . valACYclovir (VALTREX) 1000 MG tablet as directed.      No current facility-administered medications for this visit.     Allergies:   Other, Penicillins, Pepcid [famotidine], and Sulfa antibiotics   Social History:  The patient  reports that she has never smoked. She has never used smokeless tobacco. She reports that she does not drink alcohol or use drugs.   Family History:  The patient's family history includes Atrial fibrillation in her mother and sister; Breast cancer in her cousin.  ROS:  Please see the history of present illness.  All other systems are reviewed and otherwise negative.   PHYSICAL EXAM:  VS:  BP (!) 166/80   Pulse 64   Ht 5\' 5"  (1.651 m)   Wt 188 lb (85.3 kg)   BMI 31.28 kg/m  BMI: Body mass index is 31.28 kg/m. Well nourished, well developed, in no acute distress  HEENT: normocephalic, atraumatic  Neck: no JVD, carotid bruits or masses Cardiac:  RRR; no significant murmurs, no rubs, or gallops Lungs:  CTA b/l, no wheezing, rhonchi or rales  Abd: soft, nontender MS: no deformity or atrophy Ext:  no edema  Skin: warm and dry, no rash Neuro:  No gross deficits appreciated Psych: euthymic mood, full affect  ILR site is stable, no tethering or discomfort   EKG:  Done today and reviewed by myself shows  SR 64bpm, normal EKG  ILR interrogation done today and reviewed by myself:  Battery is good, R waves 0.37mV 67 tachy episodes 30 since Aug 30/2020 though these are several episodes on 5 different days, likely 5 total episodes that the rates had hovered around detection rate   04/29/16: Lexiscan myoview  The left  ventricular ejection fraction is hyperdynamic (>65%).  Nuclear stress EF: 68%.  There was no ST segment deviation noted during stress.  The study is normal.  This is a low risk study.   Normal pharmacologic nuclear stress test with no evidence for prior infarct of ischemia.   04/20/16: TTE Study Conclusions - Left ventricle: The cavity size was normal. Wall thickness was   normal. Systolic function was vigorous. The estimated ejection   fraction was in the range of 65% to 70%. Wall motion was normal;   there were no regional wall motion abnormalities. Doppler   parameters are consistent with abnormal left ventricular   relaxation (grade 1  diastolic dysfunction). The E/e&' ratio is   between 8-15, suggesting indeterminate LV filling pressure. - Left atrium: The atrium was normal in size. - Right atrium: The atrium was normal in size. - Inferior vena cava: The vessel was normal in size. The   respirophasic diameter changes were in the normal range (>= 50%),   consistent with normal central venous pressure.  Impressions: - LVEF 65-70%, normal wall thickness, normal wall motion, diastolic   dysfunction, indeterminate LV filling pressure, normal biatrial   size, normal IVC.  Recent Labs: No results found for requested labs within last 8760 hours.  No results found for requested labs within last 8760 hours.   CrCl cannot be calculated (Patient's most recent lab result is older than the maximum 21 days allowed.).   Wt Readings from Last 3 Encounters:  04/07/19 188 lb (85.3 kg)  03/21/18 186 lb 12.8 oz (84.7 kg)  02/01/17 200 lb 12.8 oz (91.1 kg)     Other studies reviewed: Additional studies/records reviewed today include: summarized above  ASSESSMENT AND PLAN:  1. SVT     She was aware of most of her SVTs, the longer lasting ones      She is not unhappy with her SVT frequency currently and not ready to pursue ablation     Hesitant to want to take a medicine daily      Avoid triggers  2. HTN     Her BP is high she says very unusual and typically does not find her BP high (on noe medicine)     This is likely the NSAID and instructed to avoid it as a daily use and reach out to her PMD for a possible alternative     She very much would like to avoid medicines, tending to not feel well with them  She is agreeable to monitor her BP daily without the NSAID, though if needs it for pain on particularly bad days to make note of days she took it Will have her see the BP clinic in 2 weeks she will bring her BP readings and her cuff. If she needs HTN management would retry low dose daily betablocker to start    Disposition: F/u with the BP clinic as noted above, monthly loop transmissions, othewrise 6 months sooner if needed  Current medicines are reviewed at length with the patient today.  The patient did not have any concerns regarding medicines.  Venetia Night, PA-C 04/07/2019 5:21 PM     [place] [address] [phone] (office)  [phone] (fax)

## 2019-04-07 NOTE — Patient Instructions (Signed)
Medication Instructions:   Your physician recommends that you continue on your current medications as directed. Please refer to the Current Medication list given to you today.  *If you need a refill on your cardiac medications before your next appointment, please call your pharmacy*  Lab Work: [place]   If you have labs (blood work) drawn today and your tests are completely normal, you will receive your results only by: Marland Kitchen MyChart Message (if you have MyChart) OR . A paper copy in the mail If you have any lab test that is abnormal or we need to change your treatment, we will call you to review the results.  Testing/Procedures:  NONE ORDERED  TODAY    Follow-Up: At [facility], you and your health needs are our priority.  As part of our continuing mission to provide you with exceptional heart care, we have created designated Provider Care Teams.  These Care Teams include your primary Cardiologist (physician) and Advanced Practice Providers (APPs -  Physician Assistants and Nurse Practitioners) who all work together to provide you with the care you need, when you need it.  Your next appointment:   WITH BLOOD PRESSURE CLINIC DEC 15 IF POSSIBLE      Other Instructions

## 2019-04-18 ENCOUNTER — Ambulatory Visit: Payer: Medicare Other

## 2019-04-20 ENCOUNTER — Other Ambulatory Visit: Payer: Self-pay | Admitting: Family Medicine

## 2019-04-20 DIAGNOSIS — Z1231 Encounter for screening mammogram for malignant neoplasm of breast: Secondary | ICD-10-CM

## 2019-04-25 NOTE — Progress Notes (Signed)
ILR remote 

## 2019-05-01 ENCOUNTER — Ambulatory Visit (INDEPENDENT_AMBULATORY_CARE_PROVIDER_SITE_OTHER): Payer: Medicare Other | Admitting: *Deleted

## 2019-05-01 DIAGNOSIS — I471 Supraventricular tachycardia: Secondary | ICD-10-CM | POA: Diagnosis not present

## 2019-05-01 LAB — CUP PACEART REMOTE DEVICE CHECK
Date Time Interrogation Session: 20201228051750
Implantable Pulse Generator Implant Date: 20180109

## 2019-05-03 NOTE — Patient Instructions (Addendum)
It was a pleasure seeing you in clinic today Mrs. Krysiak!  Today the plan is... 1. Plan to check your blood pressure once daily and keep recording readings in your blood pressure log. Please continue to write notes - they are very helpful. A pharmacist Stanton Kidney) will contact you in 2 weeks to re-assess BP readings.   Please call the PharmD clinic at [phone] if you have any questions that you would like to speak with a pharmacist about Stanton Kidney, [place], [place]).
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
## 2019-05-09 ENCOUNTER — Ambulatory Visit (INDEPENDENT_AMBULATORY_CARE_PROVIDER_SITE_OTHER): Payer: Medicare Other | Admitting: Pharmacist

## 2019-05-09 ENCOUNTER — Other Ambulatory Visit: Payer: Self-pay

## 2019-05-09 DIAGNOSIS — I10 Essential (primary) hypertension: Secondary | ICD-10-CM | POA: Diagnosis not present

## 2019-05-15 DIAGNOSIS — I8311 Varicose veins of right lower extremity with inflammation: Secondary | ICD-10-CM | POA: Diagnosis not present

## 2019-05-15 DIAGNOSIS — M79605 Pain in left leg: Secondary | ICD-10-CM | POA: Diagnosis not present

## 2019-05-15 DIAGNOSIS — I8312 Varicose veins of left lower extremity with inflammation: Secondary | ICD-10-CM | POA: Diagnosis not present

## 2019-05-15 DIAGNOSIS — M79604 Pain in right leg: Secondary | ICD-10-CM | POA: Diagnosis not present

## 2019-05-22 ENCOUNTER — Telehealth: Payer: Self-pay | Admitting: Pharmacist

## 2019-05-22 NOTE — Telephone Encounter (Signed)
Called patient on 05/22/2019 at 2:42 PM   Patient has not been able to record any BP readings since last office appt (05/09/19). She has been experiencing multiple life stressors (2 deaths in the family on top of being a caregiver to mother/husband).  Will re-attempt to call patient in 2 weeks to re-assess BP management.   Drexel Iha, PharmD PGY2 Ambulatory Care Pharmacy Resident

## 2019-06-01 ENCOUNTER — Ambulatory Visit (INDEPENDENT_AMBULATORY_CARE_PROVIDER_SITE_OTHER): Payer: Medicare Other | Admitting: *Deleted

## 2019-06-01 DIAGNOSIS — I471 Supraventricular tachycardia: Secondary | ICD-10-CM

## 2019-06-02 LAB — CUP PACEART REMOTE DEVICE CHECK
Date Time Interrogation Session: 20210128171816
Implantable Pulse Generator Implant Date: 20180109

## 2019-06-05 ENCOUNTER — Telehealth: Payer: Self-pay | Admitting: Pharmacist

## 2019-06-05 NOTE — Telephone Encounter (Signed)
Called patient on 06/05/2019 at 4:59 PM   Patient was currently driving at time of phone call and was unable to discuss BP readings. She states this week she is extremely busy with appts. Patient would prefer to be called in 1 week.   Will follow up with patient on 06/12/2019.  Thank you for involving pharmacy to assist in providing this patient's care.   Drexel Iha, PharmD PGY2 Ambulatory Care Pharmacy Resident

## 2019-06-06 ENCOUNTER — Ambulatory Visit
Admission: RE | Admit: 2019-06-06 | Discharge: 2019-06-06 | Disposition: A | Discharge status: Home / Self Care | Payer: Medicare Other | Source: Ambulatory Visit | Attending: Family Medicine | Admitting: Family Medicine

## 2019-06-06 ENCOUNTER — Other Ambulatory Visit: Payer: Self-pay

## 2019-06-06 DIAGNOSIS — Z1231 Encounter for screening mammogram for malignant neoplasm of breast: Secondary | ICD-10-CM | POA: Diagnosis not present

## 2019-06-09 ENCOUNTER — Other Ambulatory Visit: Payer: Self-pay | Admitting: Nurse Practitioner

## 2019-06-12 ENCOUNTER — Telehealth: Payer: Self-pay | Admitting: Pharmacist

## 2019-06-12 NOTE — Telephone Encounter (Signed)
Called patient on 06/12/2019 at 3:53 PM   Patient currently waiting to be seen at podiatrist for an appt and is unable to talk (although she stated previously she would be available today). She states she would be able to discuss BP on 06/16/2019 at 3PM. Plan to call patient at that time.  Thank you for involving pharmacy to assist in providing this patient's care.   Drexel Iha, PharmD PGY2 Ambulatory Care Pharmacy Resident

## 2019-06-16 ENCOUNTER — Telehealth: Payer: Self-pay | Admitting: Pharmacist

## 2019-06-16 NOTE — Telephone Encounter (Signed)
Called patient on 06/16/2019 at 3:23 PM and left HIPAA-compliant VM with instructions to call [facility] back   Plan to discuss BP management  Drexel Iha, PharmD PGY2 Ambulatory Care Pharmacy Resident

## 2019-06-30 ENCOUNTER — Telehealth: Payer: Self-pay | Admitting: Pharmacist

## 2019-06-30 NOTE — Telephone Encounter (Signed)
Called patient on 06/30/2019 at 3:46 PM and left HIPAA-compliant VM with instructions to call [facility] back  To discuss BP management.  Have attempted to contact patient 5x since prior office visit (05/09/2019). Patient has been busy or had life stressors occurring, due to this I have scheduled time to contact her to specifically to discuss BP. Patient has not answered. Patient has not returned any calls.  Will await for patient to return call at this time.   Drexel Iha, PharmD PGY2 Ambulatory Care Pharmacy Resident

## 2019-07-03 ENCOUNTER — Ambulatory Visit (INDEPENDENT_AMBULATORY_CARE_PROVIDER_SITE_OTHER): Payer: Medicare Other | Admitting: *Deleted

## 2019-07-03 DIAGNOSIS — I471 Supraventricular tachycardia: Secondary | ICD-10-CM

## 2019-07-03 LAB — CUP PACEART REMOTE DEVICE CHECK
Date Time Interrogation Session: 20210228232721
Implantable Pulse Generator Implant Date: 20180109

## 2019-07-04 NOTE — Progress Notes (Signed)
ILR Remote 

## 2019-07-27 DIAGNOSIS — M7061 Trochanteric bursitis, right hip: Secondary | ICD-10-CM | POA: Diagnosis not present

## 2019-07-27 DIAGNOSIS — M7062 Trochanteric bursitis, left hip: Secondary | ICD-10-CM | POA: Diagnosis not present

## 2019-07-28 DIAGNOSIS — R609 Edema, unspecified: Secondary | ICD-10-CM | POA: Diagnosis not present

## 2019-07-28 DIAGNOSIS — I8311 Varicose veins of right lower extremity with inflammation: Secondary | ICD-10-CM | POA: Diagnosis not present

## 2019-07-28 DIAGNOSIS — I83813 Varicose veins of bilateral lower extremities with pain: Secondary | ICD-10-CM | POA: Diagnosis not present

## 2019-07-28 DIAGNOSIS — I8312 Varicose veins of left lower extremity with inflammation: Secondary | ICD-10-CM | POA: Diagnosis not present

## 2019-08-03 ENCOUNTER — Ambulatory Visit (INDEPENDENT_AMBULATORY_CARE_PROVIDER_SITE_OTHER): Payer: Medicare Other | Admitting: *Deleted

## 2019-08-03 DIAGNOSIS — I471 Supraventricular tachycardia: Secondary | ICD-10-CM | POA: Diagnosis not present

## 2019-08-03 LAB — CUP PACEART REMOTE DEVICE CHECK
Date Time Interrogation Session: 20210401003003
Implantable Pulse Generator Implant Date: 20180109

## 2019-08-03 NOTE — Progress Notes (Signed)
ILR Remote 

## 2019-08-14 ENCOUNTER — Telehealth (INDEPENDENT_AMBULATORY_CARE_PROVIDER_SITE_OTHER): Payer: Medicare Other | Admitting: Internal Medicine

## 2019-08-14 ENCOUNTER — Other Ambulatory Visit: Payer: Self-pay

## 2019-08-14 ENCOUNTER — Encounter: Payer: Self-pay | Admitting: Internal Medicine

## 2019-08-14 ENCOUNTER — Telehealth: Payer: Self-pay

## 2019-08-14 VITALS — BP 131/79 | HR 64 | Ht 65.5 in | Wt 188.0 lb

## 2019-08-14 DIAGNOSIS — I872 Venous insufficiency (chronic) (peripheral): Secondary | ICD-10-CM

## 2019-08-14 DIAGNOSIS — I471 Supraventricular tachycardia: Secondary | ICD-10-CM | POA: Diagnosis not present

## 2019-08-14 DIAGNOSIS — I10 Essential (primary) hypertension: Secondary | ICD-10-CM | POA: Diagnosis not present

## 2019-08-14 NOTE — Telephone Encounter (Signed)
Call placed to Pt.  Gave dates in April and May available for procedures.  Pt will consider-needs to list her mother's home in [place] and is trying to decide which to do first.  She will call this nurse when she decides.

## 2019-08-14 NOTE — Telephone Encounter (Signed)
-----   Message from Thompson Grayer, MD sent at 08/14/2019  3:27 PM EDT ----- SVT ablation Carto and anesthesia  She would like for you to call her with some dates

## 2019-08-14 NOTE — Progress Notes (Signed)
Electrophysiology TeleHealth Note  Due to national recommendations of social distancing due to [place] 19, an audio telehealth visit is felt to be most appropriate for this patient at this time.  Verbal consent was obtained by me for the telehealth visit today.  The patient does not have capability for a virtual visit.  A phone visit is therefore required today.   Date:  08/14/2019   ID:  Kendra Russell, DOB 03/13/1951, MRN [id number]  Location: patient's home  Provider location:  [facility]  Evaluation Performed: Follow-up visit  PCP:  Maurice Small, MD   Electrophysiologist:  Dr Rayann Heman  Chief Complaint:  palpitations  History of Present Illness:    Kendra Russell is a 69 y.o. female who presents via telehealth conferencing today.  Since last being seen in our clinic, the patient reports doing relatively well.  She continues with intermittent episodes of tachycardia that are becoming more bothersome.  Today, she denies symptoms of chest pain, shortness of breath,  lower extremity edema, dizziness, presyncope, or syncope.  The patient is otherwise without complaint today.  The patient denies symptoms of fevers, chills, cough, or new SOB worrisome for COVID 19.  Past Medical History:  Diagnosis Date  . Arthritis   . Asthma   . Hypothyroidism 1997  . Insomnia   . Palpitations   . Varicose veins of bilateral lower extremities with other complications     Past Surgical History:  Procedure Laterality Date  . EP IMPLANTABLE DEVICE N/A 05/12/2016   Procedure: Loop Recorder Insertion;  Surgeon: Thompson Grayer, MD;  Location: [facility];  Service: Cardiovascular;  Laterality: N/A;  . KNEE SURGERY      Current Outpatient Medications  Medication Sig Dispense Refill  . ADVAIR HFA 115-21 MCG/ACT inhaler Inhale into the lungs as directed.     Marland Kitchen albuterol (PROVENTIL HFA;VENTOLIN HFA) 108 (90 Base) MCG/ACT inhaler Inhale 2 puffs into the lungs every 4 (four)  hours as needed for wheezing or shortness of breath.    . diclofenac sodium (VOLTAREN) 1 % GEL Apply 1 application topically daily as needed (knee pain).    Marland Kitchen diltiazem (CARDIZEM) 30 MG tablet Take 1 tablet by mouth daily as needed for palpitations. Please make yearly appt with Dr.Lottie Siska for November for future refills. 1st attempt 30 tablet 1  . fluticasone (FLONASE) 50 MCG/ACT nasal spray Place 2 sprays into both nostrils daily.    . Hypertonic Nasal Wash (SINUS RINSE NA) Place 1 applicator into the nose every other day.    . levothyroxine (SYNTHROID) 100 MCG tablet Take 100 mcg by mouth daily.    . Melatonin 5 MG TABS Take 5 mg by mouth at bedtime.     . Methylcellulose, Laxative, (CITRUCEL) 500 MG TABS Take 1 tablet by mouth at bedtime.    . metoprolol tartrate (LOPRESSOR) 25 MG tablet Take 0.5 tablets (12.5 mg total) by mouth 2 (two) times daily. (Patient taking differently: Take 12.5 mg by mouth daily as needed. ) 90 tablet 3  . Multiple Minerals-Vitamins (CALCIUM CITRATE-MAG-MINERALS) TABS Take 1 tablet by mouth daily.     Marland Kitchen omeprazole (PRILOSEC) 40 MG capsule Take 40 mg by mouth daily.    . valACYclovir (VALTREX) 1000 MG tablet as directed.      No current facility-administered medications for this visit.    Allergies:   Other, Penicillins, Pepcid [famotidine], and Sulfa antibiotics   Social History:  The patient  reports that she has never smoked. She  has never used smokeless tobacco. She reports that she does not drink alcohol or use drugs.   Family History:  The patient's  family history includes Atrial fibrillation in her mother and sister; Breast cancer in her cousin.   ROS:  Please see the history of present illness.   All other systems are personally reviewed and negative.    Exam:    Vital Signs:  BP 131/79   Pulse 64   Ht 5' 5.5" (1.664 m)   Wt 188 lb (85.3 kg)   BMI 30.81 kg/m   Well sounding, alert and conversant   Labs/Other Tests and Data Reviewed:    Recent  Labs: No results found for requested labs within last 8760 hours.   Wt Readings from Last 3 Encounters:  08/14/19 188 lb (85.3 kg)  04/07/19 188 lb (85.3 kg)  03/21/18 186 lb 12.8 oz (84.7 kg)     Last device remote is reviewed from [place] PDF which reveals normal device function, intermittent SVT as seen previously   ASSESSMENT & PLAN:    1.  SVT She continues with occasional episodes of SVT and would like to pursue definitive therapy Therapeutic strategies for supraventricular tachycardia including medicine and ablation were discussed in detail with the patient today. Risk, benefits, and alternatives to EP study and radiofrequency ablation were also discussed in detail today. These risks include but are not limited to stroke, bleeding, vascular damage, tamponade, perforation, damage to the heart and other structures, AV block requiring pacemaker, worsening renal function, and death. The patient understands these risk and wishes to proceed.  I will have my nurse call and offer some dates to the patient.   2.  HTN Stable No change required today  3. Venous insufficiency Considering treatment options with Dr Aleda Grana    Follow-up:  After ablation    Patient Risk:  after full review of this patients clinical status, I feel that they are at moderate risk at this time.  Today, I have spent 15 minutes with the patient with telehealth technology discussing arrhythmia management .    SignedThompson Grayer, MD  08/14/2019 3:11 PM     [address] [place] [address] [phone] (office) [phone] (fax)

## 2019-09-04 ENCOUNTER — Ambulatory Visit (INDEPENDENT_AMBULATORY_CARE_PROVIDER_SITE_OTHER): Payer: Medicare Other | Admitting: *Deleted

## 2019-09-04 DIAGNOSIS — R002 Palpitations: Secondary | ICD-10-CM

## 2019-09-04 LAB — CUP PACEART REMOTE DEVICE CHECK
Date Time Interrogation Session: 20210502003359
Implantable Pulse Generator Implant Date: 20180109

## 2019-09-05 NOTE — Progress Notes (Signed)
Carelink Summary Report / Loop Recorder 

## 2019-10-05 ENCOUNTER — Ambulatory Visit (INDEPENDENT_AMBULATORY_CARE_PROVIDER_SITE_OTHER): Payer: Medicare Other | Admitting: *Deleted

## 2019-10-05 DIAGNOSIS — R002 Palpitations: Secondary | ICD-10-CM | POA: Diagnosis not present

## 2019-10-05 LAB — CUP PACEART REMOTE DEVICE CHECK
Date Time Interrogation Session: 20210602232057
Implantable Pulse Generator Implant Date: 20180109

## 2019-10-06 ENCOUNTER — Telehealth: Payer: Self-pay

## 2019-10-06 NOTE — Telephone Encounter (Signed)
Error

## 2019-10-10 NOTE — Progress Notes (Signed)
Carelink Summary Report / Loop Recorder 

## 2019-11-07 ENCOUNTER — Ambulatory Visit (INDEPENDENT_AMBULATORY_CARE_PROVIDER_SITE_OTHER): Payer: Medicare Other | Admitting: *Deleted

## 2019-11-07 DIAGNOSIS — I471 Supraventricular tachycardia: Secondary | ICD-10-CM

## 2019-11-07 LAB — CUP PACEART REMOTE DEVICE CHECK
Date Time Interrogation Session: 20210706023644
Implantable Pulse Generator Implant Date: 20180109

## 2019-11-08 NOTE — Progress Notes (Signed)
Carelink Summary Report / Loop Recorder 

## 2019-11-24 DIAGNOSIS — E785 Hyperlipidemia, unspecified: Secondary | ICD-10-CM | POA: Diagnosis not present

## 2019-11-24 DIAGNOSIS — Z0000 Encounter for general adult medical examination without abnormal findings: Secondary | ICD-10-CM | POA: Diagnosis not present

## 2019-11-24 DIAGNOSIS — Z5181 Encounter for therapeutic drug level monitoring: Secondary | ICD-10-CM | POA: Diagnosis not present

## 2019-11-24 DIAGNOSIS — E039 Hypothyroidism, unspecified: Secondary | ICD-10-CM | POA: Diagnosis not present

## 2019-12-07 ENCOUNTER — Ambulatory Visit (INDEPENDENT_AMBULATORY_CARE_PROVIDER_SITE_OTHER): Payer: Medicare Other | Admitting: *Deleted

## 2019-12-07 DIAGNOSIS — I471 Supraventricular tachycardia: Secondary | ICD-10-CM

## 2019-12-11 LAB — CUP PACEART REMOTE DEVICE CHECK
Date Time Interrogation Session: 20210808023631
Implantable Pulse Generator Implant Date: 20180109

## 2019-12-11 NOTE — Progress Notes (Signed)
Carelink Summary Report / Loop Recorder 

## 2019-12-18 ENCOUNTER — Telehealth: Payer: Self-pay | Admitting: Emergency Medicine

## 2019-12-18 NOTE — Telephone Encounter (Signed)
Patient notified that loop recorder at RRT. Patient has question concerning need for new loop to replace current device to gather info. Patient reports that if Dr Rayann Heman does not want to replace loop recorder she will decline appointment at this time for loop extraction. Patient informed that she will receive a call if Dr Rayann Heman plans to replace loop recorder. Remote transmissions cancelled and

## 2019-12-19 NOTE — Telephone Encounter (Signed)
She has known SVT. If she does not wish to have her ILR placed then I can see as previously scheduled for follow-up.  If she wishes to have her device replaced or removed then schedule an office visit.

## 2019-12-29 DIAGNOSIS — F329 Major depressive disorder, single episode, unspecified: Secondary | ICD-10-CM | POA: Diagnosis not present

## 2020-01-03 NOTE — Telephone Encounter (Signed)
LMOM to call DC. DC # and hours provided. Discuss if she wants ILR replaced.

## 2020-01-03 NOTE — Telephone Encounter (Signed)
Patient contacted and informed that Dr. Rayann Heman would place another loop recorder if she would like to continue monitoring but it was her choice. She is unsure if she would like to have another loop recorder implanted and would like to be scheduled to see Dr Rayann Heman in the office in 2 months to discuss the matter of reimplantation.

## 2020-01-05 NOTE — Telephone Encounter (Signed)
Pt is scheduled for f/u with Dr. Rayann Heman on 03/06/20. Previously unenrolled from [employer] . Encounter closed.

## 2020-01-12 DIAGNOSIS — F329 Major depressive disorder, single episode, unspecified: Secondary | ICD-10-CM | POA: Diagnosis not present

## 2020-01-18 DIAGNOSIS — M205X1 Other deformities of toe(s) (acquired), right foot: Secondary | ICD-10-CM | POA: Diagnosis not present

## 2020-01-18 DIAGNOSIS — L84 Corns and callosities: Secondary | ICD-10-CM | POA: Diagnosis not present

## 2020-01-18 DIAGNOSIS — M2041 Other hammer toe(s) (acquired), right foot: Secondary | ICD-10-CM | POA: Diagnosis not present

## 2020-01-18 DIAGNOSIS — M2042 Other hammer toe(s) (acquired), left foot: Secondary | ICD-10-CM | POA: Diagnosis not present

## 2020-02-10 DIAGNOSIS — Z23 Encounter for immunization: Secondary | ICD-10-CM | POA: Diagnosis not present

## 2020-02-15 DIAGNOSIS — M2042 Other hammer toe(s) (acquired), left foot: Secondary | ICD-10-CM | POA: Diagnosis not present

## 2020-02-15 DIAGNOSIS — L84 Corns and callosities: Secondary | ICD-10-CM | POA: Diagnosis not present

## 2020-02-15 DIAGNOSIS — M2041 Other hammer toe(s) (acquired), right foot: Secondary | ICD-10-CM | POA: Diagnosis not present

## 2020-02-15 DIAGNOSIS — M205X1 Other deformities of toe(s) (acquired), right foot: Secondary | ICD-10-CM | POA: Diagnosis not present

## 2020-03-02 DIAGNOSIS — Z23 Encounter for immunization: Secondary | ICD-10-CM | POA: Diagnosis not present

## 2020-03-05 DIAGNOSIS — E039 Hypothyroidism, unspecified: Secondary | ICD-10-CM | POA: Diagnosis not present

## 2020-03-06 ENCOUNTER — Ambulatory Visit (INDEPENDENT_AMBULATORY_CARE_PROVIDER_SITE_OTHER): Payer: Medicare Other | Admitting: Internal Medicine

## 2020-03-06 ENCOUNTER — Encounter: Payer: Self-pay | Admitting: Internal Medicine

## 2020-03-06 ENCOUNTER — Other Ambulatory Visit: Payer: Self-pay

## 2020-03-06 VITALS — BP 156/60 | HR 96 | Ht 65.0 in | Wt 192.4 lb

## 2020-03-06 DIAGNOSIS — I471 Supraventricular tachycardia: Secondary | ICD-10-CM

## 2020-03-06 DIAGNOSIS — I10 Essential (primary) hypertension: Secondary | ICD-10-CM | POA: Diagnosis not present

## 2020-03-06 HISTORY — PX: OTHER SURGICAL HISTORY: SHX169

## 2020-03-06 NOTE — Telephone Encounter (Signed)
This encounter was created in error - please disregard.

## 2020-03-06 NOTE — Progress Notes (Signed)
PCP: Maurice Small, MD   Primary EP: Dr Lorretta Harp is a 69 y.o. female who presents today for routine electrophysiology followup.  Since last being seen in our clinic, the patient reports doing very well.  Today, she denies symptoms of palpitations, chest pain, shortness of breath,  lower extremity edema, dizziness, presyncope, or syncope.  The patient is otherwise without complaint today.   Past Medical History:  Diagnosis Date  . Arthritis   . Asthma   . Hypothyroidism 1997  . Insomnia   . Palpitations   . Varicose veins of bilateral lower extremities with other complications    Past Surgical History:  Procedure Laterality Date  . EP IMPLANTABLE DEVICE N/A 05/12/2016   Procedure: Loop Recorder Insertion;  Surgeon: Thompson Grayer, MD;  Location: [facility];  Service: Cardiovascular;  Laterality: N/A;  . KNEE SURGERY      ROS- all systems are reviewed and negatives except as per HPI above  Current Outpatient Medications  Medication Sig Dispense Refill  . ADVAIR HFA 115-21 MCG/ACT inhaler Inhale into the lungs as directed.     Marland Kitchen albuterol (PROVENTIL HFA;VENTOLIN HFA) 108 (90 Base) MCG/ACT inhaler Inhale 2 puffs into the lungs every 4 (four) hours as needed for wheezing or shortness of breath.    . diclofenac sodium (VOLTAREN) 1 % GEL Apply 1 application topically daily as needed (knee pain).    Marland Kitchen diltiazem (CARDIZEM) 30 MG tablet Take 1 tablet by mouth daily as needed for palpitations. Please make yearly appt with Dr.Verdell Kincannon for November for future refills. 1st attempt 30 tablet 1  . fluticasone (FLONASE) 50 MCG/ACT nasal spray Place 2 sprays into both nostrils daily.    . Hypertonic Nasal Wash (SINUS RINSE NA) Place 1 applicator into the nose every other day.    . levothyroxine (SYNTHROID) 100 MCG tablet Take 100 mcg by mouth daily.    . Melatonin 5 MG TABS Take 5 mg by mouth at bedtime.     . Methylcellulose, Laxative, (CITRUCEL) 500 MG TABS Take 1 tablet  by mouth at bedtime.    . metoprolol tartrate (LOPRESSOR) 25 MG tablet Take 0.5 tablets (12.5 mg total) by mouth 2 (two) times daily. (Patient taking differently: Take 12.5 mg by mouth daily as needed. ) 90 tablet 3  . Multiple Minerals-Vitamins (CALCIUM CITRATE-MAG-MINERALS) TABS Take 1 tablet by mouth daily.     Marland Kitchen omeprazole (PRILOSEC) 40 MG capsule Take 40 mg by mouth daily.    . valACYclovir (VALTREX) 1000 MG tablet as directed.      No current facility-administered medications for this visit.    Physical Exam: Vitals:   03/06/20 1346  BP: (!) 156/60  Pulse: 96  SpO2: 99%  Weight: 192 lb 6.4 oz (87.3 kg)  Height: 5\' 5"  (1.651 m)    GEN- The patient is well appearing, alert and oriented x 3 today.   Head- normocephalic, atraumatic Eyes-  Sclera clear, conjunctiva pink Ears- hearing intact Oropharynx- clear Lungs- Clear to ausculation bilaterally, normal work of breathing Heart- Regular rate and rhythm, no murmurs, rubs or gallops, PMI not laterally displaced GI- soft, NT, ND, + BS Extremities- no clubbing, cyanosis, or edema  Wt Readings from Last 3 Encounters:  03/06/20 192 lb 6.4 oz (87.3 kg)  08/14/19 188 lb (85.3 kg)  04/07/19 188 lb (85.3 kg)    EKG tracing ordered today is personally reviewed and shows sinus rhythm  Assessment and Plan:  1. Palpitations/ SVT She has  been found by ILR to have short RP SVT We have discussed SVT ablation at length on several occasions.  She prefers a conservative approach. Her ILR is at RRT.  We discussed pros and cons to device removal today. She understands that risks include bleeding and infection and wishes to proceed.   2. HTN Stable No change required today  3. Venous insufficiency Stable No change required today  Return to see EP PA in a year  Thompson Grayer MD, [facility] 03/06/2020 2:14 PM     PROCEDURES:   1. Implantable loop recorder explantation       DESCRIPTION OF PROCEDURE:  Informed written consent was  obtained.  The patient required no sedation for the procedure today.   The patients left chest was therefore prepped and draped in the usual sterile fashion.  The skin overlying the ILR monitor was infiltrated with lidocaine for local analgesia.  A 0.5-cm incision was made over the site.  The previously implanted ILR was exposed and removed using a combination of sharp and blunt dissection.  Steri- Strips and a sterile dressing were then applied. EBL<16ml.  There were no early apparent complications.     CONCLUSIONS:   1. Successful explantation of a [employer] Reveal LINQ implantable loop recorder   2. No early apparent complications.        Thompson Grayer MD, [facility] 03/06/2020 2:16 PM

## 2020-03-06 NOTE — Patient Instructions (Addendum)
Medication Instructions:  Your physician recommends that you continue on your current medications as directed. Please refer to the Current Medication list given to you today.  Labwork: None ordered.  Testing/Procedures: None ordered.   Your physician wants you to follow-up in: 1 year with Kendra Russell. You will receive a reminder letter in the mail two months in advance. If you don't receive a letter, please call our office to schedule the follow-up appointment.    Implantable Loop Recorder Placement, Care After This sheet gives you information about how to care for yourself after your procedure. Your health care provider may also give you more specific instructions. If you have problems or questions, contact your health care provider. What can I expect after the procedure? After the procedure, it is common to have:  Soreness or discomfort near the incision.  Some swelling or bruising near the incision.  Follow these instructions at home: Incision care  1.  Leave your outer dressing on for 24 hours.  After 24 hours you can remove your outer dressing and shower. 2. Leave adhesive strips in place. These skin closures may need to stay in place for 1-2 weeks. If adhesive strip edges start to loosen and curl up, you may trim the loose edges.  You may remove the strips if they have not fallen off after 2 weeks. 3. Check your incision area every day for signs of infection. Check for: a. Redness, swelling, or pain. b. Fluid or blood. c. Warmth. d. Pus or a bad smell. 4. Do not take baths, swim, or use a hot tub until your incision is completely healed. 5. If your wound site starts to bleed apply pressure.      If you have any questions/concerns please call the device clinic at [phone].  Activity  Return to your normal activities.  General instructions  Follow instructions from your health care provider about how to manage your implantable loop recorder and transmit the  information. Learn how to activate a recording if this is necessary for your type of device.  Do not go through a metal detection gate, and do not let someone hold a metal detector over your chest. Show your ID card.  Do not have an MRI unless you check with your health care provider first.  Take over-the-counter and prescription medicines only as told by your health care provider.  Keep all follow-up visits as told by your health care provider. This is important. Contact a health care provider if:  You have redness, swelling, or pain around your incision.  You have a fever.  You have pain that is not relieved by your pain medicine.  You have triggered your device because of fainting (syncope) or because of a heartbeat that feels like it is racing, slow, fluttering, or skipping (palpitations). Get help right away if you have:  Chest pain.  Difficulty breathing. Summary  After the procedure, it is common to have soreness or discomfort near the incision.  Change your dressing as told by your health care provider.  Follow instructions from your health care provider about how to manage your implantable loop recorder and transmit the information.  Keep all follow-up visits as told by your health care provider. This is important. This information is not intended to replace advice given to you by your health care provider. Make sure you discuss any questions you have with your health care provider. Document Released: 04/01/2015 Document Revised: 06/05/2017 Document Reviewed: 06/05/2017 Elsevier Patient Education  2020 [employer].

## 2020-03-08 NOTE — Addendum Note (Signed)
Addended by: Campbell Riches on: 03/08/2020 10:13 AM   Modules accepted: Orders

## 2020-04-04 DIAGNOSIS — Z23 Encounter for immunization: Secondary | ICD-10-CM | POA: Diagnosis not present

## 2020-05-10 DIAGNOSIS — S39012A Strain of muscle, fascia and tendon of lower back, initial encounter: Secondary | ICD-10-CM | POA: Diagnosis not present

## 2020-05-10 DIAGNOSIS — S300XXA Contusion of lower back and pelvis, initial encounter: Secondary | ICD-10-CM | POA: Diagnosis not present

## 2020-05-15 ENCOUNTER — Other Ambulatory Visit: Payer: Self-pay | Admitting: Family Medicine

## 2020-05-15 DIAGNOSIS — Z1231 Encounter for screening mammogram for malignant neoplasm of breast: Secondary | ICD-10-CM

## 2020-05-16 DIAGNOSIS — M6281 Muscle weakness (generalized): Secondary | ICD-10-CM | POA: Diagnosis not present

## 2020-05-16 DIAGNOSIS — S39012D Strain of muscle, fascia and tendon of lower back, subsequent encounter: Secondary | ICD-10-CM | POA: Diagnosis not present

## 2020-05-28 DIAGNOSIS — M6281 Muscle weakness (generalized): Secondary | ICD-10-CM | POA: Diagnosis not present

## 2020-05-28 DIAGNOSIS — S39012D Strain of muscle, fascia and tendon of lower back, subsequent encounter: Secondary | ICD-10-CM | POA: Diagnosis not present

## 2020-05-30 DIAGNOSIS — S39012D Strain of muscle, fascia and tendon of lower back, subsequent encounter: Secondary | ICD-10-CM | POA: Diagnosis not present

## 2020-05-30 DIAGNOSIS — M6281 Muscle weakness (generalized): Secondary | ICD-10-CM | POA: Diagnosis not present

## 2020-06-04 DIAGNOSIS — S39012D Strain of muscle, fascia and tendon of lower back, subsequent encounter: Secondary | ICD-10-CM | POA: Diagnosis not present

## 2020-06-04 DIAGNOSIS — M6281 Muscle weakness (generalized): Secondary | ICD-10-CM | POA: Diagnosis not present

## 2020-06-06 DIAGNOSIS — M6281 Muscle weakness (generalized): Secondary | ICD-10-CM | POA: Diagnosis not present

## 2020-06-06 DIAGNOSIS — S39012D Strain of muscle, fascia and tendon of lower back, subsequent encounter: Secondary | ICD-10-CM | POA: Diagnosis not present

## 2020-06-11 DIAGNOSIS — M6281 Muscle weakness (generalized): Secondary | ICD-10-CM | POA: Diagnosis not present

## 2020-06-11 DIAGNOSIS — S39012D Strain of muscle, fascia and tendon of lower back, subsequent encounter: Secondary | ICD-10-CM | POA: Diagnosis not present

## 2020-06-13 DIAGNOSIS — M6281 Muscle weakness (generalized): Secondary | ICD-10-CM | POA: Diagnosis not present

## 2020-06-13 DIAGNOSIS — S39012D Strain of muscle, fascia and tendon of lower back, subsequent encounter: Secondary | ICD-10-CM | POA: Diagnosis not present

## 2020-06-20 DIAGNOSIS — S39012D Strain of muscle, fascia and tendon of lower back, subsequent encounter: Secondary | ICD-10-CM | POA: Diagnosis not present

## 2020-06-20 DIAGNOSIS — M6281 Muscle weakness (generalized): Secondary | ICD-10-CM | POA: Diagnosis not present

## 2020-06-27 DIAGNOSIS — S39012D Strain of muscle, fascia and tendon of lower back, subsequent encounter: Secondary | ICD-10-CM | POA: Diagnosis not present

## 2020-06-27 DIAGNOSIS — M6281 Muscle weakness (generalized): Secondary | ICD-10-CM | POA: Diagnosis not present

## 2020-06-28 ENCOUNTER — Ambulatory Visit
Admission: RE | Admit: 2020-06-28 | Discharge: 2020-06-28 | Disposition: A | Discharge status: Home / Self Care | Payer: Medicare Other | Source: Ambulatory Visit | Attending: Family Medicine | Admitting: Family Medicine

## 2020-06-28 ENCOUNTER — Other Ambulatory Visit: Payer: Self-pay

## 2020-06-28 DIAGNOSIS — Z1231 Encounter for screening mammogram for malignant neoplasm of breast: Secondary | ICD-10-CM

## 2020-07-02 DIAGNOSIS — S39012D Strain of muscle, fascia and tendon of lower back, subsequent encounter: Secondary | ICD-10-CM | POA: Diagnosis not present

## 2020-07-02 DIAGNOSIS — M6281 Muscle weakness (generalized): Secondary | ICD-10-CM | POA: Diagnosis not present

## 2020-07-04 DIAGNOSIS — M6281 Muscle weakness (generalized): Secondary | ICD-10-CM | POA: Diagnosis not present

## 2020-07-04 DIAGNOSIS — S39012D Strain of muscle, fascia and tendon of lower back, subsequent encounter: Secondary | ICD-10-CM | POA: Diagnosis not present

## 2020-07-09 DIAGNOSIS — M6281 Muscle weakness (generalized): Secondary | ICD-10-CM | POA: Diagnosis not present

## 2020-07-09 DIAGNOSIS — S39012D Strain of muscle, fascia and tendon of lower back, subsequent encounter: Secondary | ICD-10-CM | POA: Diagnosis not present

## 2020-07-11 DIAGNOSIS — S39012D Strain of muscle, fascia and tendon of lower back, subsequent encounter: Secondary | ICD-10-CM | POA: Diagnosis not present

## 2020-07-11 DIAGNOSIS — L249 Irritant contact dermatitis, unspecified cause: Secondary | ICD-10-CM | POA: Diagnosis not present

## 2020-07-11 DIAGNOSIS — M6281 Muscle weakness (generalized): Secondary | ICD-10-CM | POA: Diagnosis not present

## 2020-07-16 DIAGNOSIS — M6281 Muscle weakness (generalized): Secondary | ICD-10-CM | POA: Diagnosis not present

## 2020-07-16 DIAGNOSIS — S39012D Strain of muscle, fascia and tendon of lower back, subsequent encounter: Secondary | ICD-10-CM | POA: Diagnosis not present

## 2020-07-18 DIAGNOSIS — S39012D Strain of muscle, fascia and tendon of lower back, subsequent encounter: Secondary | ICD-10-CM | POA: Diagnosis not present

## 2020-07-18 DIAGNOSIS — M6281 Muscle weakness (generalized): Secondary | ICD-10-CM | POA: Diagnosis not present

## 2020-07-23 DIAGNOSIS — S39012D Strain of muscle, fascia and tendon of lower back, subsequent encounter: Secondary | ICD-10-CM | POA: Diagnosis not present

## 2020-07-23 DIAGNOSIS — M6281 Muscle weakness (generalized): Secondary | ICD-10-CM | POA: Diagnosis not present

## 2020-07-30 DIAGNOSIS — H538 Other visual disturbances: Secondary | ICD-10-CM | POA: Diagnosis not present

## 2020-07-30 DIAGNOSIS — H2511 Age-related nuclear cataract, right eye: Secondary | ICD-10-CM | POA: Diagnosis not present

## 2020-07-30 DIAGNOSIS — H1011 Acute atopic conjunctivitis, right eye: Secondary | ICD-10-CM | POA: Diagnosis not present

## 2020-08-02 DIAGNOSIS — S39012D Strain of muscle, fascia and tendon of lower back, subsequent encounter: Secondary | ICD-10-CM | POA: Diagnosis not present

## 2020-08-02 DIAGNOSIS — M6281 Muscle weakness (generalized): Secondary | ICD-10-CM | POA: Diagnosis not present

## 2020-08-06 DIAGNOSIS — J4530 Mild persistent asthma, uncomplicated: Secondary | ICD-10-CM | POA: Diagnosis not present

## 2020-08-06 DIAGNOSIS — M6281 Muscle weakness (generalized): Secondary | ICD-10-CM | POA: Diagnosis not present

## 2020-08-06 DIAGNOSIS — S39012D Strain of muscle, fascia and tendon of lower back, subsequent encounter: Secondary | ICD-10-CM | POA: Diagnosis not present

## 2020-08-06 DIAGNOSIS — R059 Cough, unspecified: Secondary | ICD-10-CM | POA: Diagnosis not present

## 2020-08-07 ENCOUNTER — Ambulatory Visit
Admission: RE | Admit: 2020-08-07 | Discharge: 2020-08-07 | Disposition: A | Discharge status: Home / Self Care | Payer: Medicare Other | Source: Ambulatory Visit | Attending: Family Medicine | Admitting: Family Medicine

## 2020-08-07 ENCOUNTER — Other Ambulatory Visit: Payer: Self-pay

## 2020-08-07 ENCOUNTER — Other Ambulatory Visit: Payer: Self-pay | Admitting: Family Medicine

## 2020-08-07 DIAGNOSIS — R059 Cough, unspecified: Secondary | ICD-10-CM

## 2020-08-08 DIAGNOSIS — S39012D Strain of muscle, fascia and tendon of lower back, subsequent encounter: Secondary | ICD-10-CM | POA: Diagnosis not present

## 2020-08-08 DIAGNOSIS — M6281 Muscle weakness (generalized): Secondary | ICD-10-CM | POA: Diagnosis not present

## 2020-10-23 DIAGNOSIS — J019 Acute sinusitis, unspecified: Secondary | ICD-10-CM | POA: Diagnosis not present

## 2020-10-23 DIAGNOSIS — Z03818 Encounter for observation for suspected exposure to other biological agents ruled out: Secondary | ICD-10-CM | POA: Diagnosis not present

## 2020-10-23 DIAGNOSIS — J40 Bronchitis, not specified as acute or chronic: Secondary | ICD-10-CM | POA: Diagnosis not present

## 2020-10-23 DIAGNOSIS — R059 Cough, unspecified: Secondary | ICD-10-CM | POA: Diagnosis not present

## 2020-10-23 DIAGNOSIS — Z8709 Personal history of other diseases of the respiratory system: Secondary | ICD-10-CM | POA: Diagnosis not present

## 2020-10-23 DIAGNOSIS — R0981 Nasal congestion: Secondary | ICD-10-CM | POA: Diagnosis not present

## 2020-11-26 DIAGNOSIS — E039 Hypothyroidism, unspecified: Secondary | ICD-10-CM | POA: Diagnosis not present

## 2020-11-26 DIAGNOSIS — E785 Hyperlipidemia, unspecified: Secondary | ICD-10-CM | POA: Diagnosis not present

## 2020-11-26 DIAGNOSIS — E611 Iron deficiency: Secondary | ICD-10-CM | POA: Diagnosis not present

## 2020-11-26 DIAGNOSIS — Z1211 Encounter for screening for malignant neoplasm of colon: Secondary | ICD-10-CM | POA: Diagnosis not present

## 2020-11-26 DIAGNOSIS — R399 Unspecified symptoms and signs involving the genitourinary system: Secondary | ICD-10-CM | POA: Diagnosis not present

## 2020-11-26 DIAGNOSIS — Z0000 Encounter for general adult medical examination without abnormal findings: Secondary | ICD-10-CM | POA: Diagnosis not present

## 2020-11-26 DIAGNOSIS — J4530 Mild persistent asthma, uncomplicated: Secondary | ICD-10-CM | POA: Diagnosis not present

## 2020-11-26 DIAGNOSIS — R5382 Chronic fatigue, unspecified: Secondary | ICD-10-CM | POA: Diagnosis not present

## 2020-11-26 DIAGNOSIS — E538 Deficiency of other specified B group vitamins: Secondary | ICD-10-CM | POA: Diagnosis not present

## 2020-11-26 DIAGNOSIS — Z91018 Allergy to other foods: Secondary | ICD-10-CM | POA: Diagnosis not present

## 2020-11-28 DIAGNOSIS — Z1211 Encounter for screening for malignant neoplasm of colon: Secondary | ICD-10-CM | POA: Diagnosis not present

## 2020-11-29 DIAGNOSIS — S336XXD Sprain of sacroiliac joint, subsequent encounter: Secondary | ICD-10-CM | POA: Diagnosis not present

## 2020-11-29 DIAGNOSIS — S39012A Strain of muscle, fascia and tendon of lower back, initial encounter: Secondary | ICD-10-CM | POA: Diagnosis not present

## 2020-11-29 DIAGNOSIS — M545 Low back pain, unspecified: Secondary | ICD-10-CM | POA: Diagnosis not present

## 2020-12-03 DIAGNOSIS — S336XXD Sprain of sacroiliac joint, subsequent encounter: Secondary | ICD-10-CM | POA: Diagnosis not present

## 2020-12-04 DIAGNOSIS — L84 Corns and callosities: Secondary | ICD-10-CM | POA: Diagnosis not present

## 2020-12-04 DIAGNOSIS — M2042 Other hammer toe(s) (acquired), left foot: Secondary | ICD-10-CM | POA: Diagnosis not present

## 2020-12-04 DIAGNOSIS — M2041 Other hammer toe(s) (acquired), right foot: Secondary | ICD-10-CM | POA: Diagnosis not present

## 2020-12-04 DIAGNOSIS — M205X1 Other deformities of toe(s) (acquired), right foot: Secondary | ICD-10-CM | POA: Diagnosis not present

## 2020-12-06 DIAGNOSIS — S336XXD Sprain of sacroiliac joint, subsequent encounter: Secondary | ICD-10-CM | POA: Diagnosis not present

## 2020-12-11 DIAGNOSIS — S336XXD Sprain of sacroiliac joint, subsequent encounter: Secondary | ICD-10-CM | POA: Diagnosis not present

## 2020-12-11 DIAGNOSIS — E538 Deficiency of other specified B group vitamins: Secondary | ICD-10-CM | POA: Diagnosis not present

## 2020-12-12 DIAGNOSIS — S336XXD Sprain of sacroiliac joint, subsequent encounter: Secondary | ICD-10-CM | POA: Diagnosis not present

## 2020-12-17 DIAGNOSIS — S336XXD Sprain of sacroiliac joint, subsequent encounter: Secondary | ICD-10-CM | POA: Diagnosis not present

## 2020-12-26 DIAGNOSIS — S336XXD Sprain of sacroiliac joint, subsequent encounter: Secondary | ICD-10-CM | POA: Diagnosis not present

## 2021-01-01 DIAGNOSIS — S336XXD Sprain of sacroiliac joint, subsequent encounter: Secondary | ICD-10-CM | POA: Diagnosis not present

## 2021-01-03 DIAGNOSIS — S336XXD Sprain of sacroiliac joint, subsequent encounter: Secondary | ICD-10-CM | POA: Diagnosis not present

## 2021-01-08 DIAGNOSIS — S336XXD Sprain of sacroiliac joint, subsequent encounter: Secondary | ICD-10-CM | POA: Diagnosis not present

## 2021-01-10 DIAGNOSIS — S336XXD Sprain of sacroiliac joint, subsequent encounter: Secondary | ICD-10-CM | POA: Diagnosis not present

## 2021-01-14 DIAGNOSIS — S336XXD Sprain of sacroiliac joint, subsequent encounter: Secondary | ICD-10-CM | POA: Diagnosis not present

## 2021-01-16 DIAGNOSIS — S336XXD Sprain of sacroiliac joint, subsequent encounter: Secondary | ICD-10-CM | POA: Diagnosis not present

## 2021-01-21 DIAGNOSIS — S336XXD Sprain of sacroiliac joint, subsequent encounter: Secondary | ICD-10-CM | POA: Diagnosis not present

## 2021-01-23 DIAGNOSIS — S336XXD Sprain of sacroiliac joint, subsequent encounter: Secondary | ICD-10-CM | POA: Diagnosis not present

## 2021-01-24 DIAGNOSIS — Z23 Encounter for immunization: Secondary | ICD-10-CM | POA: Diagnosis not present

## 2021-01-28 DIAGNOSIS — S336XXD Sprain of sacroiliac joint, subsequent encounter: Secondary | ICD-10-CM | POA: Diagnosis not present

## 2021-02-04 DIAGNOSIS — S336XXD Sprain of sacroiliac joint, subsequent encounter: Secondary | ICD-10-CM | POA: Diagnosis not present

## 2021-02-06 DIAGNOSIS — S336XXD Sprain of sacroiliac joint, subsequent encounter: Secondary | ICD-10-CM | POA: Diagnosis not present

## 2021-02-11 DIAGNOSIS — S336XXD Sprain of sacroiliac joint, subsequent encounter: Secondary | ICD-10-CM | POA: Diagnosis not present

## 2021-02-13 DIAGNOSIS — S336XXD Sprain of sacroiliac joint, subsequent encounter: Secondary | ICD-10-CM | POA: Diagnosis not present

## 2021-02-18 DIAGNOSIS — S336XXD Sprain of sacroiliac joint, subsequent encounter: Secondary | ICD-10-CM | POA: Diagnosis not present

## 2021-02-20 DIAGNOSIS — S336XXD Sprain of sacroiliac joint, subsequent encounter: Secondary | ICD-10-CM | POA: Diagnosis not present

## 2021-02-28 DIAGNOSIS — S336XXD Sprain of sacroiliac joint, subsequent encounter: Secondary | ICD-10-CM | POA: Diagnosis not present

## 2021-03-02 NOTE — Progress Notes (Signed)
Cardiology Office Note Date:  03/02/2021  Patient ID:  [facility] Kazzandra, [place]aulniers 09/15/1950, MRN [id number] PCP:  Maurice Small, MD (Inactive)  Electrophysiologist:  Dr. Rayann Heman     Chief Complaint:   annual visit  History of Present Illness: Reyne Falconi is a 70 y.o. female with history of asthma, hypothyroidism, HTN,,b/l varicose veins, palpitations > loop > svt.  She saw Dr. Rayann Heman Nov 2019.  At that time doing well, noting occassional episodes of SVT, pt continued to decline ablation, planned for annual APP visits.   I saw her Dec 2020 She all in all is doing OK.  She is caring for her Mom (now living with her) who is still somewhat independent though needs som help.  She also helps her husband (who is mostly independent though no longer drives since his stroke some years ago, and requires some help from her as well)  She has had no CP or SOB, no exertional intolerances.  No dizzy spells, near syncope or syncope. She has had some SVT episodes, 2 for sure She has learned that if she does not sleep well, and fatigued she is more apt to have a tachycardia, one happened after her grand daughter slipped fell in her home, this made her aware of a leak in her attic into her home, and was a very stressful night, slept very poorly and the following day had an SVT. Once a couple days after Thanksgiving she was eating fast and this set one off.   She is not unhappy with the frequency of her tachycardias, she is aware of them, they make her feel fatigued.  She can usually just stop, take some long deep breaths and it will settle, if this does not work in a few minutes she will take a metoprolol lay down and it will eventually stop. She tells me she has never taken a PRN diltiazem but has them, she does not take the metoprolol except when she has a tachycardia, says they make her feel to tired, does not like them. She also mentions she tends to be sensitive to medicines. She was  recently started on Meloxican for her arthritis, pain/swelling in one of her fingers especially and makes her feel swollen/puffy, under her eyes especially BP was high, cautioned on NSAIDs asked to monitor, she did not want to make any medicine changes/explore ablative strategies for her SVT  She last saw Dr. Rayann Heman Nov 2021, mentioned a number of discussions about ablations, though the patient did not want to pursue this.  ILR was RRT and removed No changes made, planned for APP visit in a year.   TODAY She is doing well over all. She has a lot on her plate, her 64 y/o mom lives with them, her husband has had a stroke, while doing OK, she has to do the driving, errands and so on. Through all of this since last visit she has only used prn metoprolol once, was Yesterday, gt overheated/anxious and some GI boating seemed to trigger an episode, was able to finsh checking out, sat in her car, took a 1/2 tab and closed her eys and settled  No CP, no SOB No near syncope or syncope.    Device information MDT ILR, implanted 05/12/2016, palpitations >> RRT >>> removed   Past Medical History:  Diagnosis Date   Arthritis    Asthma    Hypothyroidism 1997   Insomnia    Palpitations    Varicose veins of bilateral lower extremities  with other complications     Past Surgical History:  Procedure Laterality Date   EP IMPLANTABLE DEVICE N/A 05/12/2016   Procedure: Loop Recorder Insertion;  Surgeon: Thompson Grayer, MD;  Location: [place] [facility];  Service: Cardiovascular;  Laterality: N/A;   implantable loop recorder removal  03/06/2020   MDT reveal LINQ removed in office by Dr Rayann Heman   KNEE SURGERY      Current Outpatient Medications  Medication Sig Dispense Refill   ADVAIR HFA 115-21 MCG/ACT inhaler Inhale into the lungs as directed.      albuterol (PROVENTIL HFA;VENTOLIN HFA) 108 (90 Base) MCG/ACT inhaler Inhale 2 puffs into the lungs every 4 (four) hours as needed for wheezing or shortness of  breath.     diclofenac sodium (VOLTAREN) 1 % GEL Apply 1 application topically daily as needed (knee pain).     diltiazem (CARDIZEM) 30 MG tablet Take 1 tablet by mouth daily as needed for palpitations. Please make yearly appt with Dr.Allred for November for future refills. 1st attempt 30 tablet 1   fluticasone (FLONASE) 50 MCG/ACT nasal spray Place 2 sprays into both nostrils daily.     Hypertonic Nasal Wash (SINUS RINSE NA) Place 1 applicator into the nose every other day.     levothyroxine (SYNTHROID) 100 MCG tablet Take 100 mcg by mouth daily.     Melatonin 5 MG TABS Take 5 mg by mouth at bedtime.      Methylcellulose, Laxative, (CITRUCEL) 500 MG TABS Take 1 tablet by mouth at bedtime.     metoprolol tartrate (LOPRESSOR) 25 MG tablet Take 0.5 tablets (12.5 mg total) by mouth 2 (two) times daily. (Patient taking differently: Take 12.5 mg by mouth daily as needed. ) 90 tablet 3   Multiple Minerals-Vitamins (CALCIUM CITRATE-MAG-MINERALS) TABS Take 1 tablet by mouth daily.      omeprazole (PRILOSEC) 40 MG capsule Take 40 mg by mouth daily.     valACYclovir (VALTREX) 1000 MG tablet as directed.      No current facility-administered medications for this visit.    Allergies:   Other, Penicillins, Pepcid [famotidine], and Sulfa antibiotics   Social History:  The patient  reports that she has never smoked. She has never used smokeless tobacco. She reports that she does not drink alcohol and does not use drugs.   Family History:  The patient's family history includes Atrial fibrillation in her mother and sister; Breast cancer in her cousin.  ROS:  Please see the history of present illness.  All other systems are reviewed and otherwise negative.   PHYSICAL EXAM:  VS:  There were no vitals taken for this visit. BMI: There is no height or weight on file to calculate BMI. Well nourished, well developed, in no acute distress  HEENT: normocephalic, atraumatic  Neck: no JVD, carotid bruits or  masses Cardiac:  RRR; no significant murmurs, no rubs, or gallops Lungs:  CTA b/l, no wheezing, rhonchi or rales  Abd: soft, nontender MS: no deformity or atrophy Ext:  no edema  Skin: warm and dry, no rash Neuro:  No gross deficits appreciated Psych: euthymic mood, full affect    EKG:  Done today and reviewed by myself shows  SR 61bpm, no changes    04/29/16: Lexiscan myoview The left ventricular ejection fraction is hyperdynamic (>65%). Nuclear stress EF: 68%. There was no ST segment deviation noted during stress. The study is normal. This is a low risk study.   Normal pharmacologic nuclear stress test with no evidence  for prior infarct of ischemia.   04/20/16: TTE Study Conclusions - Left ventricle: The cavity size was normal. Wall thickness was   normal. Systolic function was vigorous. The estimated ejection   fraction was in the range of 65% to 70%. Wall motion was normal;   there were no regional wall motion abnormalities. Doppler   parameters are consistent with abnormal left ventricular   relaxation (grade 1 diastolic dysfunction). The E/e&' ratio is   between 8-15, suggesting indeterminate LV filling pressure. - Left atrium: The atrium was normal in size. - Right atrium: The atrium was normal in size. - Inferior vena cava: The vessel was normal in size. The   respirophasic diameter changes were in the normal range (>= 50%),   consistent with normal central venous pressure.   Impressions: - LVEF 65-70%, normal wall thickness, normal wall motion, diastolic   dysfunction, indeterminate LV filling pressure, normal biatrial   size, normal IVC.  Recent Labs: No results found for requested labs within last 8760 hours.  No results found for requested labs within last 8760 hours.   CrCl cannot be calculated (Patient's most recent lab result is older than the maximum 21 days allowed.).   Wt Readings from Last 3 Encounters:  03/06/20 192 lb 6.4 oz (87.3 kg)   08/14/19 188 lb (85.3 kg)  04/07/19 188 lb (85.3 kg)     Other studies reviewed: Additional studies/records reviewed today include: summarized above  ASSESSMENT AND PLAN:  1. SVT     Rare episodes Takes nothing daily, uses dilt or metoprolol only prn  2. HTN     Unusually high today 2/2 to some frustration she has had this AM     No changes   Disposition: we will continue to see her annually, sooner if needed  Current medicines are reviewed at length with the patient today.  The patient did not have any concerns regarding medicines.  Venetia Night, PA-C 03/02/2021 10:21 AM     [facility] [address] [place] [address] [phone] (office)  [phone] (fax)

## 2021-03-04 ENCOUNTER — Ambulatory Visit (INDEPENDENT_AMBULATORY_CARE_PROVIDER_SITE_OTHER): Payer: Medicare Other | Admitting: Physician Assistant

## 2021-03-04 ENCOUNTER — Other Ambulatory Visit: Payer: Self-pay

## 2021-03-04 ENCOUNTER — Encounter: Payer: Self-pay | Admitting: Physician Assistant

## 2021-03-04 VITALS — BP 140/80 | HR 61 | Ht 64.75 in | Wt 192.4 lb

## 2021-03-04 DIAGNOSIS — I471 Supraventricular tachycardia, unspecified: Secondary | ICD-10-CM

## 2021-03-04 DIAGNOSIS — I10 Essential (primary) hypertension: Secondary | ICD-10-CM

## 2021-03-04 MED ORDER — METOPROLOL TARTRATE 25 MG PO TABS: 12.5000 mg | ORAL_TABLET | Freq: Two times a day (BID) | ORAL | 3 refills | Status: DC

## 2021-03-04 MED ORDER — DILTIAZEM HCL 30 MG PO TABS: ORAL_TABLET | ORAL | 0 refills | Status: DC

## 2021-03-04 NOTE — Patient Instructions (Signed)
Medication Instructions:   Your physician recommends that you continue on your current medications as directed. Please refer to the Current Medication list given to you today.   *If you need a refill on your cardiac medications before your next appointment, please call your pharmacy*   Lab Work:  NONE ORDERED  TODAY   If you have labs (blood work) drawn today and your tests are completely normal, you will receive your results only by: MyChart Message (if you have MyChart) OR A paper copy in the mail If you have any lab test that is abnormal or we need to change your treatment, we will call you to review the results.   Testing/Procedures: NONE ORDERED  TODAY     Follow-Up: At [facility], you and your health needs are our priority.  As part of our continuing mission to provide you with exceptional heart care, we have created designated Provider Care Teams.  These Care Teams include your primary Cardiologist (physician) and Advanced Practice Providers (APPs -  Physician Assistants and Nurse Practitioners) who all work together to provide you with the care you need, when you need it.  We recommend signing up for the patient portal called "MyChart".  Sign up information is provided on this After Visit Summary.  MyChart is used to connect with patients for Virtual Visits (Telemedicine).  Patients are able to view lab/test results, encounter notes, upcoming appointments, etc.  Non-urgent messages can be sent to your provider as well.   To learn more about what you can do with MyChart, go to [email or web address].    Your next appointment:   1 year(s)  The format for your next appointment:   In Person  Provider:   You will see one of the following Advanced Practice Providers on your designated Care Team:   Renee Ursuy, PA-C       Other Instructions  

## 2021-03-05 DIAGNOSIS — S336XXD Sprain of sacroiliac joint, subsequent encounter: Secondary | ICD-10-CM | POA: Diagnosis not present

## 2021-03-18 DIAGNOSIS — S336XXD Sprain of sacroiliac joint, subsequent encounter: Secondary | ICD-10-CM | POA: Diagnosis not present

## 2021-03-25 DIAGNOSIS — S336XXD Sprain of sacroiliac joint, subsequent encounter: Secondary | ICD-10-CM | POA: Diagnosis not present

## 2021-04-01 DIAGNOSIS — S336XXD Sprain of sacroiliac joint, subsequent encounter: Secondary | ICD-10-CM | POA: Diagnosis not present

## 2021-04-09 DIAGNOSIS — S336XXD Sprain of sacroiliac joint, subsequent encounter: Secondary | ICD-10-CM | POA: Diagnosis not present

## 2021-04-17 DIAGNOSIS — S336XXD Sprain of sacroiliac joint, subsequent encounter: Secondary | ICD-10-CM | POA: Diagnosis not present

## 2021-05-23 ENCOUNTER — Other Ambulatory Visit: Payer: Self-pay | Admitting: Family Medicine

## 2021-05-23 DIAGNOSIS — Z1231 Encounter for screening mammogram for malignant neoplasm of breast: Secondary | ICD-10-CM

## 2021-05-26 ENCOUNTER — Other Ambulatory Visit: Payer: Self-pay | Admitting: Physician Assistant

## 2021-07-01 ENCOUNTER — Other Ambulatory Visit: Payer: Self-pay

## 2021-07-01 ENCOUNTER — Ambulatory Visit
Admission: RE | Admit: 2021-07-01 | Discharge: 2021-07-01 | Disposition: A | Discharge status: Home / Self Care | Payer: Medicare Other | Source: Ambulatory Visit | Attending: Family Medicine | Admitting: Family Medicine

## 2021-07-01 DIAGNOSIS — Z1231 Encounter for screening mammogram for malignant neoplasm of breast: Secondary | ICD-10-CM | POA: Diagnosis not present

## 2021-12-30 DIAGNOSIS — Z79899 Other long term (current) drug therapy: Secondary | ICD-10-CM | POA: Diagnosis not present

## 2021-12-30 DIAGNOSIS — Z0000 Encounter for general adult medical examination without abnormal findings: Secondary | ICD-10-CM | POA: Diagnosis not present

## 2021-12-30 DIAGNOSIS — Z1159 Encounter for screening for other viral diseases: Secondary | ICD-10-CM | POA: Diagnosis not present

## 2021-12-30 DIAGNOSIS — E2839 Other primary ovarian failure: Secondary | ICD-10-CM | POA: Diagnosis not present

## 2021-12-30 DIAGNOSIS — I471 Supraventricular tachycardia: Secondary | ICD-10-CM | POA: Diagnosis not present

## 2021-12-30 DIAGNOSIS — E039 Hypothyroidism, unspecified: Secondary | ICD-10-CM | POA: Diagnosis not present

## 2021-12-30 DIAGNOSIS — J4530 Mild persistent asthma, uncomplicated: Secondary | ICD-10-CM | POA: Diagnosis not present

## 2021-12-30 DIAGNOSIS — M19049 Primary osteoarthritis, unspecified hand: Secondary | ICD-10-CM | POA: Diagnosis not present

## 2021-12-30 DIAGNOSIS — E785 Hyperlipidemia, unspecified: Secondary | ICD-10-CM | POA: Diagnosis not present

## 2021-12-30 DIAGNOSIS — R030 Elevated blood-pressure reading, without diagnosis of hypertension: Secondary | ICD-10-CM | POA: Diagnosis not present

## 2021-12-30 DIAGNOSIS — B001 Herpesviral vesicular dermatitis: Secondary | ICD-10-CM | POA: Diagnosis not present

## 2021-12-31 ENCOUNTER — Other Ambulatory Visit: Payer: Self-pay | Admitting: Family Medicine

## 2021-12-31 DIAGNOSIS — Z23 Encounter for immunization: Secondary | ICD-10-CM | POA: Diagnosis not present

## 2021-12-31 DIAGNOSIS — E2839 Other primary ovarian failure: Secondary | ICD-10-CM

## 2022-01-02 DIAGNOSIS — Z1211 Encounter for screening for malignant neoplasm of colon: Secondary | ICD-10-CM | POA: Diagnosis not present

## 2022-02-06 DIAGNOSIS — G4700 Insomnia, unspecified: Secondary | ICD-10-CM | POA: Diagnosis not present

## 2022-02-06 DIAGNOSIS — K219 Gastro-esophageal reflux disease without esophagitis: Secondary | ICD-10-CM | POA: Diagnosis not present

## 2022-02-06 DIAGNOSIS — E785 Hyperlipidemia, unspecified: Secondary | ICD-10-CM | POA: Diagnosis not present

## 2022-02-06 DIAGNOSIS — E039 Hypothyroidism, unspecified: Secondary | ICD-10-CM | POA: Diagnosis not present

## 2022-02-06 DIAGNOSIS — J4530 Mild persistent asthma, uncomplicated: Secondary | ICD-10-CM | POA: Diagnosis not present

## 2022-02-06 DIAGNOSIS — N183 Chronic kidney disease, stage 3 unspecified: Secondary | ICD-10-CM | POA: Diagnosis not present

## 2022-02-06 DIAGNOSIS — J45901 Unspecified asthma with (acute) exacerbation: Secondary | ICD-10-CM | POA: Diagnosis not present

## 2022-02-18 DIAGNOSIS — I10 Essential (primary) hypertension: Secondary | ICD-10-CM | POA: Diagnosis not present

## 2022-02-18 DIAGNOSIS — R002 Palpitations: Secondary | ICD-10-CM | POA: Diagnosis not present

## 2022-02-23 ENCOUNTER — Encounter: Payer: Self-pay | Admitting: Physician Assistant

## 2022-02-23 ENCOUNTER — Ambulatory Visit: Payer: Medicare Other | Attending: Physician Assistant | Admitting: Physician Assistant

## 2022-02-23 VITALS — BP 138/76 | HR 70 | Ht 64.73 in | Wt 193.0 lb

## 2022-02-23 DIAGNOSIS — E039 Hypothyroidism, unspecified: Secondary | ICD-10-CM | POA: Insufficient documentation

## 2022-02-23 DIAGNOSIS — I471 Supraventricular tachycardia, unspecified: Secondary | ICD-10-CM | POA: Insufficient documentation

## 2022-02-23 DIAGNOSIS — R030 Elevated blood-pressure reading, without diagnosis of hypertension: Secondary | ICD-10-CM | POA: Diagnosis not present

## 2022-02-23 DIAGNOSIS — R142 Eructation: Secondary | ICD-10-CM | POA: Diagnosis not present

## 2022-02-23 DIAGNOSIS — I10 Essential (primary) hypertension: Secondary | ICD-10-CM | POA: Diagnosis not present

## 2022-02-23 DIAGNOSIS — F439 Reaction to severe stress, unspecified: Secondary | ICD-10-CM | POA: Diagnosis not present

## 2022-02-23 DIAGNOSIS — R140 Abdominal distension (gaseous): Secondary | ICD-10-CM | POA: Diagnosis not present

## 2022-02-23 MED ORDER — DILTIAZEM HCL 30 MG PO TABS: ORAL_TABLET | ORAL | 3 refills | Status: DC

## 2022-02-23 NOTE — Patient Instructions (Addendum)
Medication Instructions:  Your physician recommends that you continue on your current medications as directed. Please refer to the Current Medication list given to you today.  *If you need a refill on your cardiac medications before your next appointment, please call your pharmacy*   Lab Work: None If you have labs (blood work) drawn today and your tests are completely normal, you will receive your results only by: [place] (if you have MyChart) OR A paper copy in the mail If you have any lab test that is abnormal or we need to change your treatment, we will call you to review the results.   Follow-Up: At [facility], you and your health needs are our priority.  As part of our continuing mission to provide you with exceptional heart care, we have created designated Provider Care Teams.  These Care Teams include your primary Cardiologist (physician) and Advanced Practice Providers (APPs -  Physician Assistants and Nurse Practitioners) who all work together to provide you with the care you need, when you need it.  We recommend signing up for the patient portal called "MyChart".  Sign up information is provided on this After Visit Summary.  MyChart is used to connect with patients for Virtual Visits (Telemedicine).  Patients are able to view lab/test results, encounter notes, upcoming appointments, etc.  Non-urgent messages can be sent to your provider as well.   To learn more about what you can do with MyChart, go to [email or web address].    Your next appointment:   03/03/22 at 1:45 PM  The format for your next appointment:   In Person  Provider:   Dr Myles Gip  Other Preferred blood pressure cuff brand as discussed is Omron.  Important Information About Sugar

## 2022-02-23 NOTE — Progress Notes (Signed)
Office Visit    Patient Name: Kendra Russell Date of Encounter: 02/23/2022  PCP:  Saintclair Halsted, [place]  Cardiologist:  None  Advanced Practice Provider:  No care team member to display Electrophysiologist:  Dr. Rayann Heman  HPI    Kendra Russell is a 71 y.o. female with a past medical history significant for asthma, hypothyroidism, hypertension, bilateral varicose veins, palpitations>loop>SVT presents today for annual follow-up appointment.  She was last seen 03/04/2021 by EP.  At that time she had a lot on her plate.  Her 87 year old mother was living with her and her husband had a stroke.  She was doing all the driving and errands and so 1.  She had only used metoprolol once for her palpitations.  She denied chest pain, shortness of breath, syncope, or near syncope.  Today, she is under a lot of stress because her husband had a stroke in 2015. She cannot get too hot or it makes the SVT worse. She also takes care of her mom who is 28 years old. Feels like she has to burp and she knew she would have an episode. She gets the closest seat at the table to sit down. She did not take any PRN metoprolol or diltizem because she did not think about it at the time. When she walked to the couch she got a tingle in her left arm which is new and she had never felt before. Rhythm strip done by EMS which was okay so she did not go to the hospital. She started to feel better.  She would like to discuss SVT ablation with Dr. Rayann Heman.  Reports no shortness of breath nor dyspnea on exertion. Reports no chest pain, pressure, or tightness. No edema, orthopnea, PND.   Past Medical History    Past Medical History:  Diagnosis Date   Arthritis    Asthma    Hypothyroidism 1997   Insomnia    Palpitations    Varicose veins of bilateral lower extremities with other complications    Past Surgical History:  Procedure Laterality Date   EP IMPLANTABLE DEVICE N/A  05/12/2016   Procedure: Loop Recorder Insertion;  Surgeon: Thompson Grayer, MD;  Location: [place] [facility];  Service: Cardiovascular;  Laterality: N/A;   implantable loop recorder removal  03/06/2020   MDT reveal LINQ removed in office by Dr Rayann Heman   KNEE SURGERY      Allergies  Allergies  Allergen Reactions   Other     Some pain meds (unsure of all names - Codeine is definitely one of them) -  Causes arms to jerk, hallucinations and nausea   Penicillins     Mom told her she was allergic- unknown   Pepcid [Famotidine]     Deep leg pains   Sulfa Antibiotics     unknown   EKGs/Labs/Other Studies Reviewed:   The following studies were reviewed today: none  EKG:  EKG is  ordered today.  The ekg ordered today demonstrates normal sinus rhythm, 70 bpm  Recent Labs: No results found for requested labs within last 365 days.  Recent Lipid Panel No results found for: "CHOL", "TRIG", "HDL", "CHOLHDL", "VLDL", "LDLCALC", "LDLDIRECT"  Home Medications   Current Meds  Medication Sig   ADVAIR HFA 115-21 MCG/ACT inhaler Inhale into the lungs as directed.    albuterol (PROVENTIL HFA;VENTOLIN HFA) 108 (90 Base) MCG/ACT inhaler Inhale 2 puffs into the lungs every 4 (four) hours as needed  for wheezing or shortness of breath.   diclofenac sodium (VOLTAREN) 1 % GEL Apply 1 application topically daily as needed (knee pain).   fluticasone (FLONASE) 50 MCG/ACT nasal spray Place 2 sprays into both nostrils daily.   Hypertonic Nasal Wash (SINUS RINSE NA) Place 1 applicator into the nose every other day.   levothyroxine (SYNTHROID) 112 MCG tablet Take 112 mcg by mouth every morning.   losartan (COZAAR) 50 MG tablet Take 50 mg by mouth daily.   Melatonin 5 MG TABS Take 5 mg by mouth at bedtime.    Methylcellulose, Laxative, 500 MG TABS Take 1 tablet by mouth at bedtime.   metoprolol tartrate (LOPRESSOR) 25 MG tablet Take 12.5 mg by mouth 2 (two) times daily as needed.   Multiple Minerals-Vitamins  (CALCIUM CITRATE-MAG-MINERALS) TABS Take 1 tablet by mouth daily.    omeprazole (PRILOSEC) 40 MG capsule Take 40 mg by mouth daily. Pt is taking 40 mg bid for 2 weeks.   valACYclovir (VALTREX) 1000 MG tablet as directed.    [DISCONTINUED] diltiazem (CARDIZEM) 30 MG tablet TAKE 1 TABLET DAILY AS NEEDED FOR PALPITATIONS. MAKE YEARLY APPT WITH DR.ALLRED FOR MORE REFILLS.     Review of Systems      All other systems reviewed and are otherwise negative except as noted above.  Physical Exam    VS:  BP 138/76   Pulse 70   Ht 5' 4.73" (1.644 m)   Wt 193 lb (87.5 kg)   SpO2 97%   BMI 32.39 kg/m  , BMI Body mass index is 32.39 kg/m.  Wt Readings from Last 3 Encounters:  02/23/22 193 lb (87.5 kg)  03/04/21 192 lb 6.4 oz (87.3 kg)  03/06/20 192 lb 6.4 oz (87.3 kg)     GEN: Well nourished, well developed, in no acute distress. HEENT: normal. Neck: Supple, no JVD, carotid bruits, or masses. Cardiac: RRR, no murmurs, rubs, or gallops. No clubbing, cyanosis, edema.  Radials/PT 2+ and equal bilaterally.  Respiratory:  Respirations regular and unlabored, clear to auscultation bilaterally. GI: Soft, nontender, nondistended. MS: No deformity or atrophy. Skin: Warm and dry, no rash. Neuro:  Strength and sensation are intact. Psych: Normal affect.  Assessment & Plan    SVT -tingle in her left arm with this most recent episode -stress induced  -She has as needed metoprolol and diltiazem to take during episodes -She cannot take daily metoprolol because she does not tolerate it (fatigue and doesn't like the way it makes her feel) -She would like an appointment with Dr. Rayann Heman for an EP physician to discuss SVT ablation  HTN -Well-controlled today but at the time of EMS arrival was very high 210/110  -Would continue her current medications which include as needed Lopressor 12.5 mg for SVT, as needed Cardizem 30 mg for palpitations, losartan 50 mg daily -If blood pressure continues to elevate  can titrate her losartan as needed   Hypothyroidism -Continue current dose of levothyroxine and follow-up with your primary care        Disposition: Follow up with EP physician at soonest availability.  Signed, Elgie Collard, PA-C 02/23/2022, 5:15 PM [facility]

## 2022-03-03 ENCOUNTER — Ambulatory Visit: Payer: Medicare Other | Attending: Cardiovascular Disease | Admitting: Cardiovascular Disease

## 2022-03-03 ENCOUNTER — Encounter: Payer: Self-pay | Admitting: Cardiovascular Disease

## 2022-03-03 VITALS — BP 132/88 | HR 80 | Ht 64.75 in | Wt 193.0 lb

## 2022-03-03 DIAGNOSIS — I471 Supraventricular tachycardia, unspecified: Secondary | ICD-10-CM | POA: Diagnosis not present

## 2022-03-03 NOTE — Progress Notes (Signed)
Cardiology Office Note:    Date:  03/03/2022   ID:  Kendra Russell, DOB [birth year], MRN [id number]  PCP:  Saintclair Halsted, [place] Providers Cardiologist:  None Electrophysiologist:  Melida Quitter, MD     Referring MD: London Pepper, MD   Chief complaint: fast heart rates  History of Present Illness:    Kendra Russell is a 71 y.o. female with a hx of asthma, hypothyroidism referred for arrhythmia management.  A loop recorder was placed for palpitations and detected a short RP SVT with a rate of about 180 bpm. EP study and ablation were recommended on several occasions, but she declined and opted for a conservative approach. The loop recorder was removed when it reached ERI.  Today she presents to discuss the EP study and ablation procedure. She had an episode recently with palpitations associated with arm tingling which was particularly frightening for her.   Past Medical History:  Diagnosis Date   Arthritis    Asthma    Hypothyroidism 1997   Insomnia    Palpitations    Varicose veins of bilateral lower extremities with other complications     Past Surgical History:  Procedure Laterality Date   EP IMPLANTABLE DEVICE N/A 05/12/2016   Procedure: Loop Recorder Insertion;  Surgeon: Thompson Grayer, MD;  Location: [place] [facility];  Service: Cardiovascular;  Laterality: N/A;   implantable loop recorder removal  03/06/2020   MDT reveal LINQ removed in office by Dr Rayann Heman   KNEE SURGERY      Current Medications: Current Meds  Medication Sig   ADVAIR HFA 230-21 MCG/ACT inhaler Inhale 2 puffs into the lungs 2 (two) times daily.   albuterol (PROVENTIL HFA;VENTOLIN HFA) 108 (90 Base) MCG/ACT inhaler Inhale 2 puffs into the lungs every 4 (four) hours as needed for wheezing or shortness of breath.   diclofenac sodium (VOLTAREN) 1 % GEL Apply 1 application topically daily as needed (knee pain).   diltiazem (CARDIZEM) 30 MG tablet TAKE 1 TABLET  DAILY AS NEEDED FOR PALPITATIONS.   fluticasone (FLONASE) 50 MCG/ACT nasal spray Place 2 sprays into both nostrils daily.   Hypertonic Nasal Wash (SINUS RINSE NA) Place 1 applicator into the nose every other day.   levothyroxine (SYNTHROID) 112 MCG tablet Take 112 mcg by mouth every morning.   losartan (COZAAR) 50 MG tablet Take 50 mg by mouth daily.   Melatonin 5 MG TABS Take 5 mg by mouth at bedtime.    Methylcellulose, Laxative, 500 MG TABS Take 1 tablet by mouth at bedtime.   metoprolol tartrate (LOPRESSOR) 25 MG tablet Take 12.5 mg by mouth 2 (two) times daily as needed.   Multiple Minerals-Vitamins (CALCIUM CITRATE-MAG-MINERALS) TABS Take 1 tablet by mouth daily.    omeprazole (PRILOSEC) 40 MG capsule Take 40 mg by mouth daily. Pt is taking 40 mg bid for 2 weeks.   valACYclovir (VALTREX) 1000 MG tablet as directed.      Allergies:   Other, Penicillins, Pepcid [famotidine], and Sulfa antibiotics   Social History   Socioeconomic History   Marital status: Married    Spouse name: Not on file   Number of children: Not on file   Years of education: Not on file   Highest education level: Not on file  Occupational History   Not on file  Tobacco Use   Smoking status: Never   Smokeless tobacco: Never  Substance and Sexual Activity   Alcohol use: No   Drug  use: No   Sexual activity: Not on file  Other Topics Concern   Not on file  Social History Narrative   Pt lives in [place] with spouse.   Retired.  Worked in eye care (clerical)   Social Determinants of Health   Financial Resource Strain: Not on file  Food Insecurity: Not on file  Transportation Needs: Not on file  Physical Activity: Not on file  Stress: Not on file  Social Connections: Not on file     Family History: The patient's family history includes Atrial fibrillation in her mother and sister; Breast cancer in her cousin.  ROS:   Please see the history of present illness.    All other systems reviewed and  are negative.  EKGs/Labs/Other Studies Reviewed:     TTE 2017 - LVEF 65-70%, normal wall thickness, normal wall motion, diastolic    dysfunction, indeterminate LV filling pressure, normal biatrial    size, normal IVC.   EKG:  Last EKG results: today - normal sinus rhythm   Recent Labs: No results found for requested labs within last 365 days.     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 Pt is now interested in undergoing EP study. I think we will perform a three catheter EP study and avoid left femoral vein access. I described the rationale, logistics, and risks of the procedure which include but are not limited to bleeding, need for pacemaker, need for drainage tube, cardiothoracic surgery, prolonged hospital stay.  The risk of serious complications including stroke heart attack and death are low but nonzero.  She would like to proceed.          Medication Adjustments/Labs and Tests Ordered: Current medicines are reviewed at length with the patient today.  Concerns regarding medicines are outlined above.  Orders Placed This Encounter  Procedures   Basic metabolic panel   CBC with Differential/Platelet   EKG 12-Lead   No orders of the defined types were placed in this encounter.    Signed, Melida Quitter, MD  03/03/2022 2:33 PM    [place]

## 2022-03-03 NOTE — Patient Instructions (Signed)
Medication Instructions:  Your physician recommends that you continue on your current medications as directed. Please refer to the Current Medication list given to you today.  *If you need a refill on your cardiac medications before your next appointment, please call your pharmacy*  Lab Work: BMET and CBC If you have labs (blood work) drawn today and your tests are completely normal, you will receive your results only by: MyChart Message (if you have MyChart) OR A paper copy in the mail If you have any lab test that is abnormal or we need to change your treatment, we will call you to review the results.  Testing/Procedures: Your physician has recommended that you have an ablation. Catheter ablation is a medical procedure used to treat some cardiac arrhythmias (irregular heartbeats). During catheter ablation, a long, thin, flexible tube is put into a blood vessel in your groin (upper thigh), or neck. This tube is called an ablation catheter. It is then guided to your heart through the blood vessel. Radio frequency waves destroy small areas of heart tissue where abnormal heartbeats may cause an arrhythmia to start. Please see the instruction sheet given to you today.   Follow-Up: At [facility], you and your health needs are our priority.  As part of our continuing mission to provide you with exceptional heart care, we have created designated Provider Care Teams.  These Care Teams include your primary Cardiologist (physician) and Advanced Practice Providers (APPs -  Physician Assistants and Nurse Practitioners) who all work together to provide you with the care you need, when you need it.  Your next appointment:   See instruction letter  Important Information About Sugar       

## 2022-04-09 DIAGNOSIS — J4540 Moderate persistent asthma, uncomplicated: Secondary | ICD-10-CM | POA: Diagnosis not present

## 2022-04-09 DIAGNOSIS — K219 Gastro-esophageal reflux disease without esophagitis: Secondary | ICD-10-CM | POA: Diagnosis not present

## 2022-04-09 DIAGNOSIS — G4709 Other insomnia: Secondary | ICD-10-CM | POA: Diagnosis not present

## 2022-04-09 DIAGNOSIS — I10 Essential (primary) hypertension: Secondary | ICD-10-CM | POA: Diagnosis not present

## 2022-04-17 ENCOUNTER — Telehealth: Payer: Self-pay | Admitting: Cardiovascular Disease

## 2022-04-17 NOTE — Telephone Encounter (Signed)
Spoke with the patient who states that she is not ready to go through with the procedure at this time. She states that she has had 5 deaths of people close to her over the past couple months. She states that her mind isn't ready yet. She would like to meet with Dr. Myles Gip again before she reschedules.

## 2022-04-17 NOTE — Telephone Encounter (Signed)
Pt would like a call back to discuss upcoming procedure she would like to cancel. Please advise.

## 2022-04-21 ENCOUNTER — Other Ambulatory Visit: Payer: PRIVATE HEALTH INSURANCE

## 2022-05-01 ENCOUNTER — Encounter (HOSPITAL_COMMUNITY): Payer: Self-pay

## 2022-05-01 ENCOUNTER — Ambulatory Visit (HOSPITAL_COMMUNITY): Admit: 2022-05-01 | Payer: PRIVATE HEALTH INSURANCE | Admitting: Cardiovascular Disease

## 2022-05-01 SURGERY — SVT ABLATION
Anesthesia: General

## 2022-05-15 DIAGNOSIS — K219 Gastro-esophageal reflux disease without esophagitis: Secondary | ICD-10-CM | POA: Diagnosis not present

## 2022-05-15 DIAGNOSIS — Z1211 Encounter for screening for malignant neoplasm of colon: Secondary | ICD-10-CM | POA: Diagnosis not present

## 2022-06-08 ENCOUNTER — Ambulatory Visit: Payer: Medicare Other | Admitting: Cardiovascular Disease

## 2022-06-16 ENCOUNTER — Other Ambulatory Visit: Payer: Self-pay | Admitting: Family Medicine

## 2022-06-16 DIAGNOSIS — Z1231 Encounter for screening mammogram for malignant neoplasm of breast: Secondary | ICD-10-CM

## 2022-06-18 ENCOUNTER — Other Ambulatory Visit: Payer: PRIVATE HEALTH INSURANCE

## 2022-06-18 DIAGNOSIS — R051 Acute cough: Secondary | ICD-10-CM | POA: Diagnosis not present

## 2022-06-18 DIAGNOSIS — U071 COVID-19: Secondary | ICD-10-CM | POA: Diagnosis not present

## 2022-06-18 DIAGNOSIS — R0981 Nasal congestion: Secondary | ICD-10-CM | POA: Diagnosis not present

## 2022-06-18 DIAGNOSIS — R52 Pain, unspecified: Secondary | ICD-10-CM | POA: Diagnosis not present

## 2022-06-30 DIAGNOSIS — R0981 Nasal congestion: Secondary | ICD-10-CM | POA: Diagnosis not present

## 2022-06-30 DIAGNOSIS — U071 COVID-19: Secondary | ICD-10-CM | POA: Diagnosis not present

## 2022-06-30 DIAGNOSIS — R5383 Other fatigue: Secondary | ICD-10-CM | POA: Diagnosis not present

## 2022-07-23 DIAGNOSIS — G4700 Insomnia, unspecified: Secondary | ICD-10-CM | POA: Diagnosis not present

## 2022-07-23 DIAGNOSIS — N644 Mastodynia: Secondary | ICD-10-CM | POA: Diagnosis not present

## 2022-07-23 DIAGNOSIS — B001 Herpesviral vesicular dermatitis: Secondary | ICD-10-CM | POA: Diagnosis not present

## 2022-07-23 DIAGNOSIS — I10 Essential (primary) hypertension: Secondary | ICD-10-CM | POA: Diagnosis not present

## 2022-07-23 DIAGNOSIS — L989 Disorder of the skin and subcutaneous tissue, unspecified: Secondary | ICD-10-CM | POA: Diagnosis not present

## 2022-07-23 DIAGNOSIS — J4540 Moderate persistent asthma, uncomplicated: Secondary | ICD-10-CM | POA: Diagnosis not present

## 2022-07-23 DIAGNOSIS — E039 Hypothyroidism, unspecified: Secondary | ICD-10-CM | POA: Diagnosis not present

## 2022-07-23 DIAGNOSIS — F329 Major depressive disorder, single episode, unspecified: Secondary | ICD-10-CM | POA: Diagnosis not present

## 2022-07-23 DIAGNOSIS — F439 Reaction to severe stress, unspecified: Secondary | ICD-10-CM | POA: Diagnosis not present

## 2022-07-28 ENCOUNTER — Other Ambulatory Visit: Payer: Self-pay | Admitting: Family Medicine

## 2022-07-28 DIAGNOSIS — N644 Mastodynia: Secondary | ICD-10-CM

## 2022-07-30 IMAGING — MG MM DIGITAL SCREENING BILAT W/ TOMO AND CAD
8 series · 8 of 24 positions shown · non-contrast
Comparison: Previous exam(s).

CLINICAL DATA: Screening.

EXAM:
DIGITAL SCREENING BILATERAL MAMMOGRAM WITH TOMOSYNTHESIS AND CAD
TECHNIQUE: Bilateral screening digital craniocaudal and mediolateral oblique
mammograms were obtained. Bilateral screening digital breast
tomosynthesis was performed. The images were evaluated with
computer-aided detection.

[R MLO synth-2D]
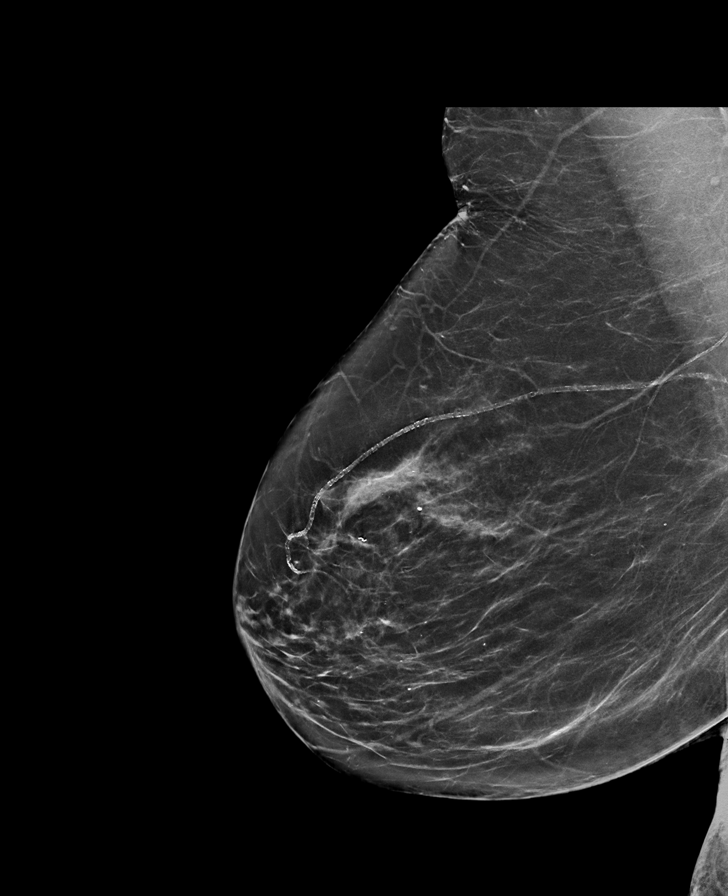

[L CC synth-2D]
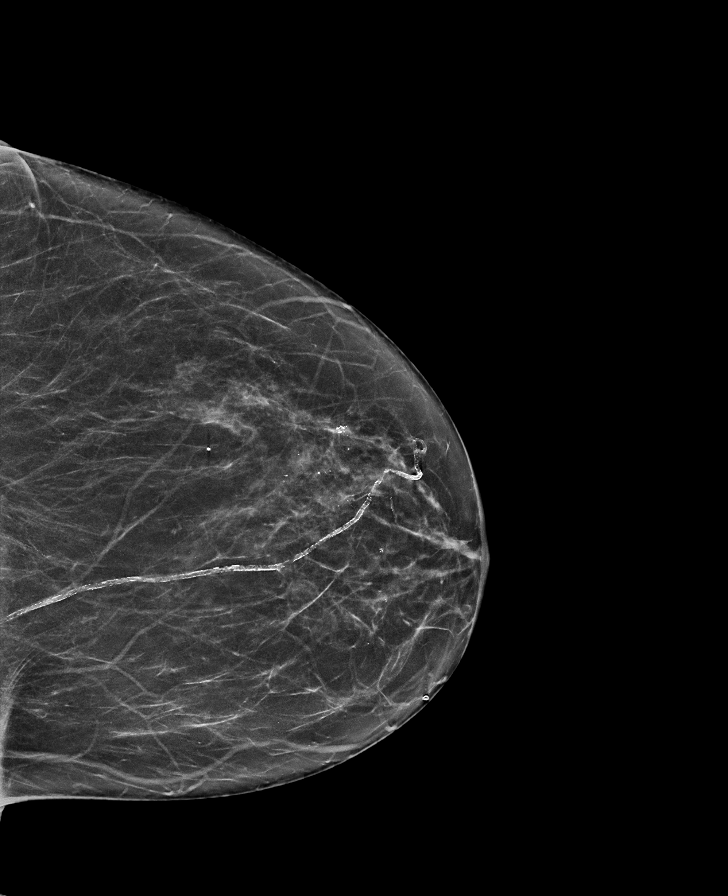

[R CC synth-2D]
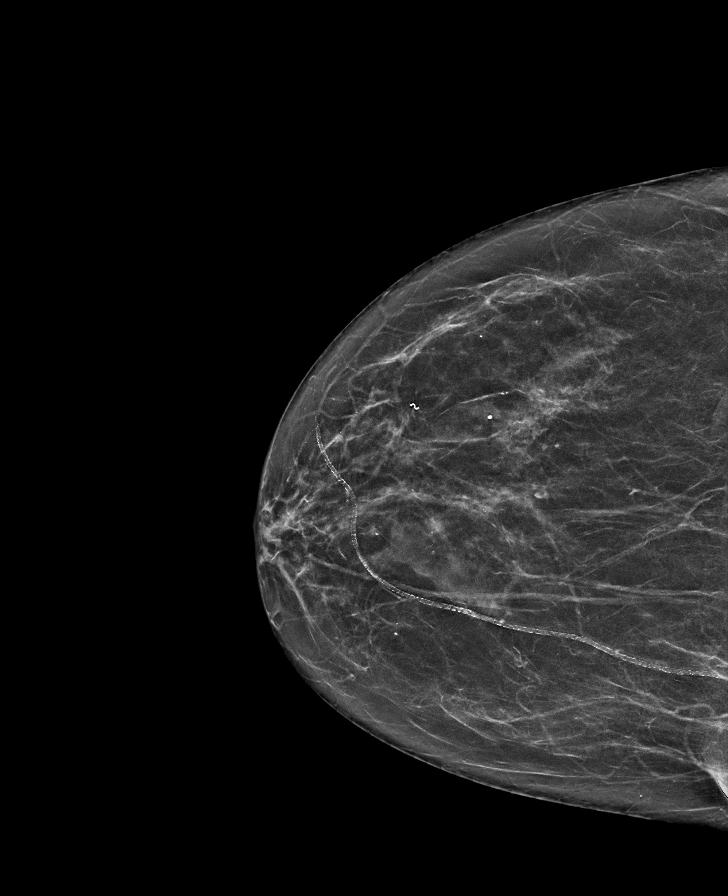

[L MLO synth-2D]
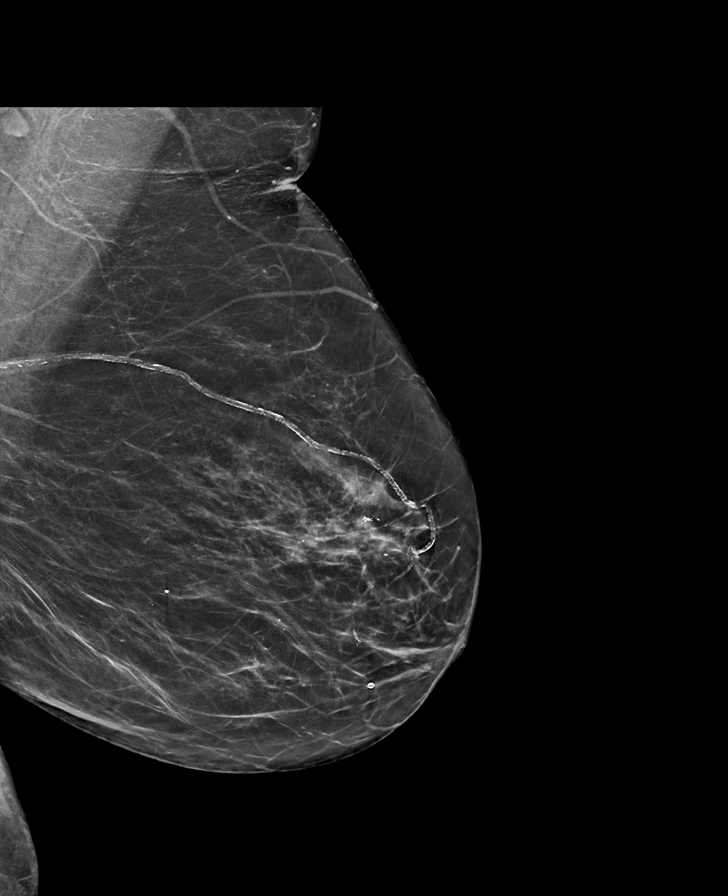

[R MLO tomo · tomo slice 37/74.0]
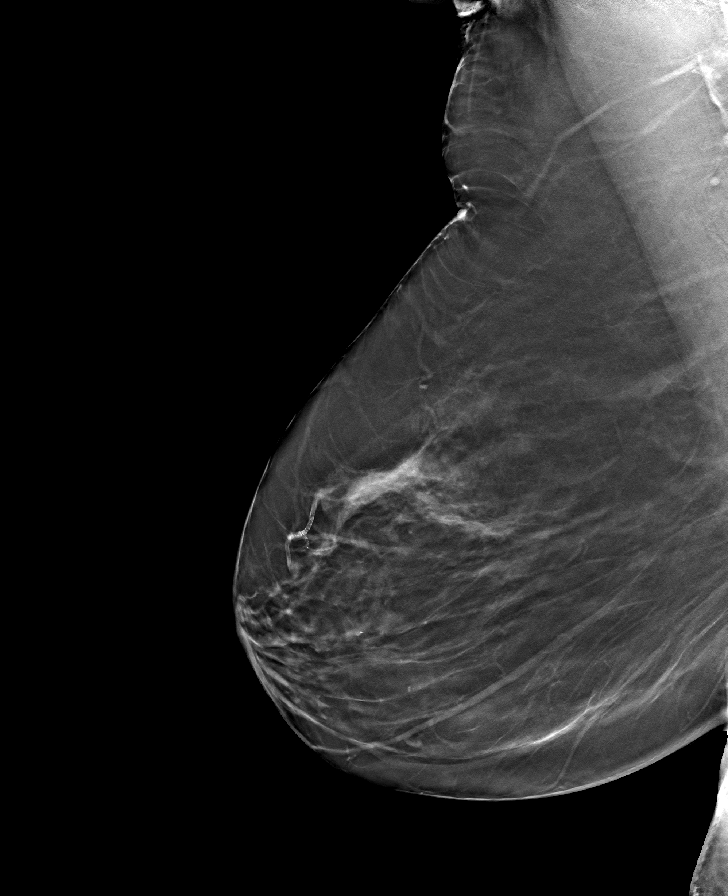

[R CC tomo · tomo slice 32/63.0]
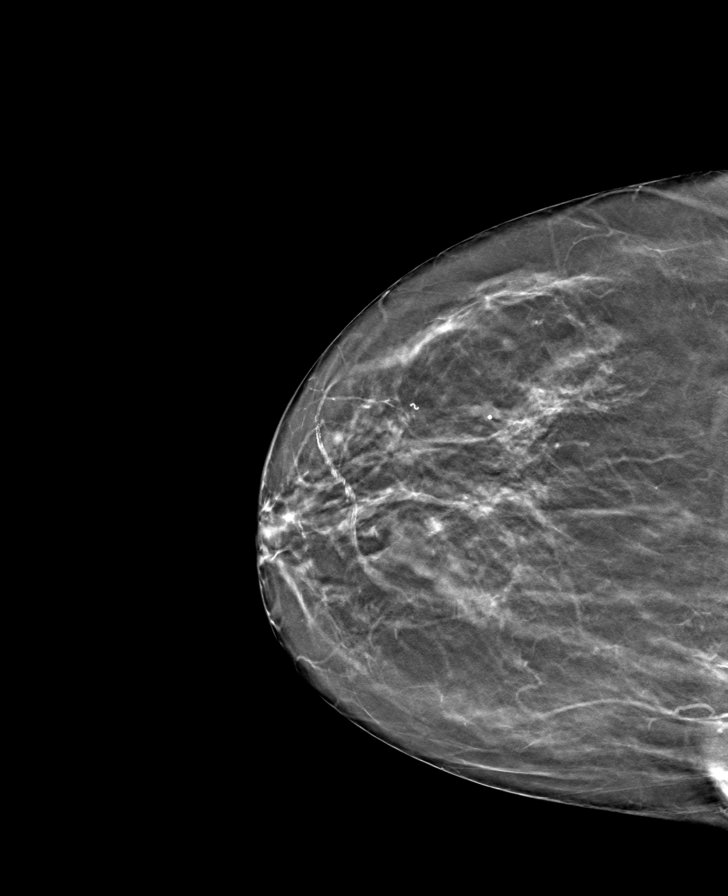

[L CC tomo · tomo slice 31/62.0]
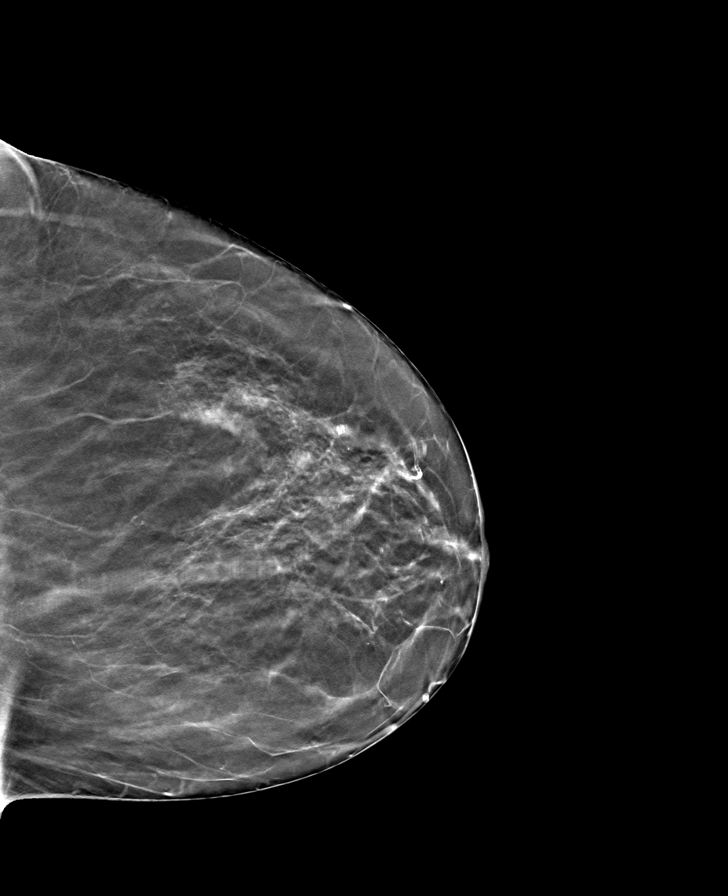

[L MLO tomo · tomo slice 37/72.0]
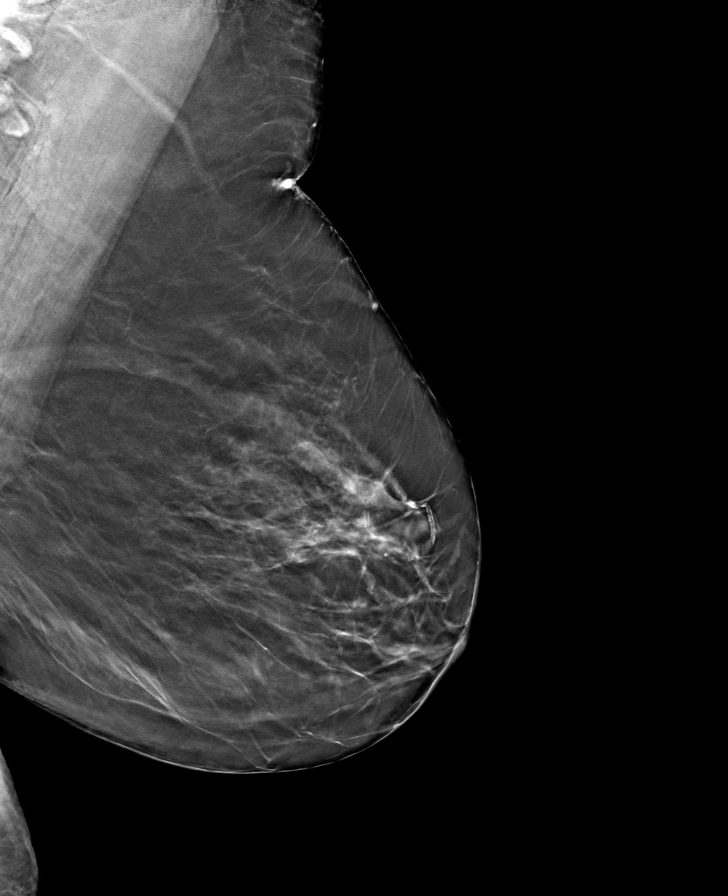

[8 of 24 positions shown; findings below may reference images not displayed]

ACR Breast Density Category b: There are scattered areas of
fibroglandular density.
FINDINGS: There are no findings suspicious for malignancy.
IMPRESSION: No mammographic evidence of malignancy. A result letter of this
screening mammogram will be mailed directly to the patient.

RECOMMENDATION:
Screening mammogram in one year. (Code:51-O-LD2)

BI-RADS CATEGORY  1: Negative.

## 2022-08-12 ENCOUNTER — Ambulatory Visit
Admission: RE | Admit: 2022-08-12 | Discharge: 2022-08-12 | Disposition: A | Discharge status: Home / Self Care | Payer: Medicare Other | Source: Ambulatory Visit | Attending: Family Medicine | Admitting: Family Medicine

## 2022-08-12 DIAGNOSIS — N644 Mastodynia: Secondary | ICD-10-CM

## 2022-08-27 DIAGNOSIS — E039 Hypothyroidism, unspecified: Secondary | ICD-10-CM | POA: Diagnosis not present

## 2022-10-02 ENCOUNTER — Other Ambulatory Visit: Payer: PRIVATE HEALTH INSURANCE

## 2022-11-12 DIAGNOSIS — Z96651 Presence of right artificial knee joint: Secondary | ICD-10-CM | POA: Diagnosis not present

## 2022-11-12 DIAGNOSIS — M25562 Pain in left knee: Secondary | ICD-10-CM | POA: Diagnosis not present

## 2022-11-12 DIAGNOSIS — Z96642 Presence of left artificial hip joint: Secondary | ICD-10-CM | POA: Diagnosis not present

## 2022-11-12 DIAGNOSIS — Z96641 Presence of right artificial hip joint: Secondary | ICD-10-CM | POA: Diagnosis not present

## 2022-11-12 DIAGNOSIS — M25561 Pain in right knee: Secondary | ICD-10-CM | POA: Diagnosis not present

## 2022-11-12 DIAGNOSIS — M5442 Lumbago with sciatica, left side: Secondary | ICD-10-CM | POA: Diagnosis not present

## 2022-11-12 DIAGNOSIS — Z96652 Presence of left artificial knee joint: Secondary | ICD-10-CM | POA: Diagnosis not present

## 2022-12-08 DIAGNOSIS — M5416 Radiculopathy, lumbar region: Secondary | ICD-10-CM | POA: Diagnosis not present

## 2022-12-09 DIAGNOSIS — H538 Other visual disturbances: Secondary | ICD-10-CM | POA: Diagnosis not present

## 2022-12-15 DIAGNOSIS — M5416 Radiculopathy, lumbar region: Secondary | ICD-10-CM | POA: Diagnosis not present

## 2022-12-17 DIAGNOSIS — M5416 Radiculopathy, lumbar region: Secondary | ICD-10-CM | POA: Diagnosis not present

## 2022-12-23 DIAGNOSIS — E349 Endocrine disorder, unspecified: Secondary | ICD-10-CM | POA: Diagnosis not present

## 2022-12-23 DIAGNOSIS — J45909 Unspecified asthma, uncomplicated: Secondary | ICD-10-CM | POA: Diagnosis not present

## 2022-12-23 DIAGNOSIS — N958 Other specified menopausal and perimenopausal disorders: Secondary | ICD-10-CM | POA: Diagnosis not present

## 2022-12-24 DIAGNOSIS — M5416 Radiculopathy, lumbar region: Secondary | ICD-10-CM | POA: Diagnosis not present

## 2022-12-29 DIAGNOSIS — M5416 Radiculopathy, lumbar region: Secondary | ICD-10-CM | POA: Diagnosis not present

## 2022-12-31 DIAGNOSIS — M5416 Radiculopathy, lumbar region: Secondary | ICD-10-CM | POA: Diagnosis not present

## 2023-01-12 DIAGNOSIS — M5416 Radiculopathy, lumbar region: Secondary | ICD-10-CM | POA: Diagnosis not present

## 2023-01-19 DIAGNOSIS — M5416 Radiculopathy, lumbar region: Secondary | ICD-10-CM | POA: Diagnosis not present

## 2023-02-02 DIAGNOSIS — M5416 Radiculopathy, lumbar region: Secondary | ICD-10-CM | POA: Diagnosis not present

## 2023-02-04 DIAGNOSIS — M5416 Radiculopathy, lumbar region: Secondary | ICD-10-CM | POA: Diagnosis not present

## 2023-02-09 DIAGNOSIS — M5416 Radiculopathy, lumbar region: Secondary | ICD-10-CM | POA: Diagnosis not present

## 2023-02-16 DIAGNOSIS — M5416 Radiculopathy, lumbar region: Secondary | ICD-10-CM | POA: Diagnosis not present

## 2023-02-18 DIAGNOSIS — M5416 Radiculopathy, lumbar region: Secondary | ICD-10-CM | POA: Diagnosis not present

## 2023-02-26 DIAGNOSIS — M5416 Radiculopathy, lumbar region: Secondary | ICD-10-CM | POA: Diagnosis not present

## 2023-03-02 DIAGNOSIS — E039 Hypothyroidism, unspecified: Secondary | ICD-10-CM | POA: Diagnosis not present

## 2023-03-02 DIAGNOSIS — J4530 Mild persistent asthma, uncomplicated: Secondary | ICD-10-CM | POA: Diagnosis not present

## 2023-03-02 DIAGNOSIS — M5416 Radiculopathy, lumbar region: Secondary | ICD-10-CM | POA: Diagnosis not present

## 2023-03-02 DIAGNOSIS — I471 Supraventricular tachycardia, unspecified: Secondary | ICD-10-CM | POA: Diagnosis not present

## 2023-03-02 DIAGNOSIS — Z1211 Encounter for screening for malignant neoplasm of colon: Secondary | ICD-10-CM | POA: Diagnosis not present

## 2023-03-02 DIAGNOSIS — Z599 Problem related to housing and economic circumstances, unspecified: Secondary | ICD-10-CM | POA: Diagnosis not present

## 2023-03-02 DIAGNOSIS — R2233 Localized swelling, mass and lump, upper limb, bilateral: Secondary | ICD-10-CM | POA: Diagnosis not present

## 2023-03-02 DIAGNOSIS — Z0000 Encounter for general adult medical examination without abnormal findings: Secondary | ICD-10-CM | POA: Diagnosis not present

## 2023-03-02 DIAGNOSIS — B001 Herpesviral vesicular dermatitis: Secondary | ICD-10-CM | POA: Diagnosis not present

## 2023-03-02 DIAGNOSIS — R030 Elevated blood-pressure reading, without diagnosis of hypertension: Secondary | ICD-10-CM | POA: Diagnosis not present

## 2023-03-02 DIAGNOSIS — E785 Hyperlipidemia, unspecified: Secondary | ICD-10-CM | POA: Diagnosis not present

## 2023-03-02 DIAGNOSIS — Z79899 Other long term (current) drug therapy: Secondary | ICD-10-CM | POA: Diagnosis not present

## 2023-03-02 DIAGNOSIS — M19049 Primary osteoarthritis, unspecified hand: Secondary | ICD-10-CM | POA: Diagnosis not present

## 2023-03-05 DIAGNOSIS — Z23 Encounter for immunization: Secondary | ICD-10-CM | POA: Diagnosis not present

## 2023-03-09 DIAGNOSIS — M5416 Radiculopathy, lumbar region: Secondary | ICD-10-CM | POA: Diagnosis not present

## 2023-03-11 DIAGNOSIS — M5416 Radiculopathy, lumbar region: Secondary | ICD-10-CM | POA: Diagnosis not present

## 2023-03-18 DIAGNOSIS — M5416 Radiculopathy, lumbar region: Secondary | ICD-10-CM | POA: Diagnosis not present

## 2023-03-22 DIAGNOSIS — M5416 Radiculopathy, lumbar region: Secondary | ICD-10-CM | POA: Diagnosis not present

## 2023-03-24 DIAGNOSIS — M5416 Radiculopathy, lumbar region: Secondary | ICD-10-CM | POA: Diagnosis not present

## 2023-03-25 DIAGNOSIS — Z0000 Encounter for general adult medical examination without abnormal findings: Secondary | ICD-10-CM | POA: Diagnosis not present

## 2023-03-25 DIAGNOSIS — Z1211 Encounter for screening for malignant neoplasm of colon: Secondary | ICD-10-CM | POA: Diagnosis not present

## 2023-03-29 DIAGNOSIS — M5416 Radiculopathy, lumbar region: Secondary | ICD-10-CM | POA: Diagnosis not present

## 2023-04-05 DIAGNOSIS — M5416 Radiculopathy, lumbar region: Secondary | ICD-10-CM | POA: Diagnosis not present

## 2023-04-06 DIAGNOSIS — M5416 Radiculopathy, lumbar region: Secondary | ICD-10-CM | POA: Diagnosis not present

## 2023-04-13 DIAGNOSIS — M5416 Radiculopathy, lumbar region: Secondary | ICD-10-CM | POA: Diagnosis not present

## 2023-04-15 DIAGNOSIS — M5416 Radiculopathy, lumbar region: Secondary | ICD-10-CM | POA: Diagnosis not present

## 2023-04-29 DIAGNOSIS — M5416 Radiculopathy, lumbar region: Secondary | ICD-10-CM | POA: Diagnosis not present

## 2023-05-04 DIAGNOSIS — M5416 Radiculopathy, lumbar region: Secondary | ICD-10-CM | POA: Diagnosis not present

## 2023-05-17 DIAGNOSIS — M5416 Radiculopathy, lumbar region: Secondary | ICD-10-CM | POA: Diagnosis not present

## 2023-05-25 DIAGNOSIS — M5416 Radiculopathy, lumbar region: Secondary | ICD-10-CM | POA: Diagnosis not present

## 2023-06-01 DIAGNOSIS — M5416 Radiculopathy, lumbar region: Secondary | ICD-10-CM | POA: Diagnosis not present

## 2023-06-15 DIAGNOSIS — M5416 Radiculopathy, lumbar region: Secondary | ICD-10-CM | POA: Diagnosis not present

## 2023-06-22 DIAGNOSIS — M5416 Radiculopathy, lumbar region: Secondary | ICD-10-CM | POA: Diagnosis not present

## 2023-07-01 DIAGNOSIS — M5416 Radiculopathy, lumbar region: Secondary | ICD-10-CM | POA: Diagnosis not present

## 2023-07-06 DIAGNOSIS — M5416 Radiculopathy, lumbar region: Secondary | ICD-10-CM | POA: Diagnosis not present

## 2023-07-07 ENCOUNTER — Other Ambulatory Visit: Payer: Self-pay | Admitting: Family Medicine

## 2023-07-07 DIAGNOSIS — Z1231 Encounter for screening mammogram for malignant neoplasm of breast: Secondary | ICD-10-CM

## 2023-07-13 DIAGNOSIS — M5416 Radiculopathy, lumbar region: Secondary | ICD-10-CM | POA: Diagnosis not present

## 2023-07-26 DIAGNOSIS — M2012 Hallux valgus (acquired), left foot: Secondary | ICD-10-CM | POA: Diagnosis not present

## 2023-07-26 DIAGNOSIS — M2011 Hallux valgus (acquired), right foot: Secondary | ICD-10-CM | POA: Diagnosis not present

## 2023-07-26 DIAGNOSIS — L565 Disseminated superficial actinic porokeratosis (DSAP): Secondary | ICD-10-CM | POA: Diagnosis not present

## 2023-07-26 DIAGNOSIS — M205X1 Other deformities of toe(s) (acquired), right foot: Secondary | ICD-10-CM | POA: Diagnosis not present

## 2023-07-26 DIAGNOSIS — M205X2 Other deformities of toe(s) (acquired), left foot: Secondary | ICD-10-CM | POA: Diagnosis not present

## 2023-07-27 DIAGNOSIS — M5416 Radiculopathy, lumbar region: Secondary | ICD-10-CM | POA: Diagnosis not present

## 2023-08-03 DIAGNOSIS — M5416 Radiculopathy, lumbar region: Secondary | ICD-10-CM | POA: Diagnosis not present

## 2023-08-12 DIAGNOSIS — M5416 Radiculopathy, lumbar region: Secondary | ICD-10-CM | POA: Diagnosis not present

## 2023-08-13 ENCOUNTER — Ambulatory Visit
Admission: RE | Admit: 2023-08-13 | Discharge: 2023-08-13 | Disposition: A | Discharge status: Home / Self Care | Payer: PRIVATE HEALTH INSURANCE | Source: Ambulatory Visit | Attending: Family Medicine | Admitting: Family Medicine

## 2023-08-13 DIAGNOSIS — Z1231 Encounter for screening mammogram for malignant neoplasm of breast: Secondary | ICD-10-CM | POA: Diagnosis not present

## 2023-08-18 DIAGNOSIS — M5416 Radiculopathy, lumbar region: Secondary | ICD-10-CM | POA: Diagnosis not present

## 2023-08-24 DIAGNOSIS — M5416 Radiculopathy, lumbar region: Secondary | ICD-10-CM | POA: Diagnosis not present

## 2023-08-31 DIAGNOSIS — M205X1 Other deformities of toe(s) (acquired), right foot: Secondary | ICD-10-CM | POA: Diagnosis not present

## 2023-08-31 DIAGNOSIS — L565 Disseminated superficial actinic porokeratosis (DSAP): Secondary | ICD-10-CM | POA: Diagnosis not present

## 2023-08-31 DIAGNOSIS — Q828 Other specified congenital malformations of skin: Secondary | ICD-10-CM | POA: Diagnosis not present

## 2023-08-31 DIAGNOSIS — M2011 Hallux valgus (acquired), right foot: Secondary | ICD-10-CM | POA: Diagnosis not present

## 2023-08-31 DIAGNOSIS — M2012 Hallux valgus (acquired), left foot: Secondary | ICD-10-CM | POA: Diagnosis not present

## 2023-08-31 DIAGNOSIS — M5416 Radiculopathy, lumbar region: Secondary | ICD-10-CM | POA: Diagnosis not present

## 2023-08-31 DIAGNOSIS — M205X2 Other deformities of toe(s) (acquired), left foot: Secondary | ICD-10-CM | POA: Diagnosis not present

## 2023-09-02 DIAGNOSIS — G4700 Insomnia, unspecified: Secondary | ICD-10-CM | POA: Diagnosis not present

## 2023-09-02 DIAGNOSIS — M25559 Pain in unspecified hip: Secondary | ICD-10-CM | POA: Diagnosis not present

## 2023-09-02 DIAGNOSIS — I10 Essential (primary) hypertension: Secondary | ICD-10-CM | POA: Diagnosis not present

## 2023-09-02 DIAGNOSIS — E039 Hypothyroidism, unspecified: Secondary | ICD-10-CM | POA: Diagnosis not present

## 2023-09-02 DIAGNOSIS — H9201 Otalgia, right ear: Secondary | ICD-10-CM | POA: Diagnosis not present

## 2023-09-02 DIAGNOSIS — I471 Supraventricular tachycardia, unspecified: Secondary | ICD-10-CM | POA: Diagnosis not present

## 2023-09-07 DIAGNOSIS — M5416 Radiculopathy, lumbar region: Secondary | ICD-10-CM | POA: Diagnosis not present

## 2023-09-14 DIAGNOSIS — M5416 Radiculopathy, lumbar region: Secondary | ICD-10-CM | POA: Diagnosis not present

## 2023-09-21 DIAGNOSIS — H2511 Age-related nuclear cataract, right eye: Secondary | ICD-10-CM | POA: Diagnosis not present

## 2023-09-21 DIAGNOSIS — M5416 Radiculopathy, lumbar region: Secondary | ICD-10-CM | POA: Diagnosis not present

## 2023-09-21 DIAGNOSIS — H538 Other visual disturbances: Secondary | ICD-10-CM | POA: Diagnosis not present

## 2023-10-04 DIAGNOSIS — M545 Low back pain, unspecified: Secondary | ICD-10-CM | POA: Diagnosis not present

## 2023-10-04 DIAGNOSIS — M5416 Radiculopathy, lumbar region: Secondary | ICD-10-CM | POA: Diagnosis not present

## 2023-10-12 DIAGNOSIS — M5416 Radiculopathy, lumbar region: Secondary | ICD-10-CM | POA: Diagnosis not present

## 2023-10-13 ENCOUNTER — Ambulatory Visit: Payer: PRIVATE HEALTH INSURANCE | Attending: Cardiovascular Disease | Admitting: Cardiovascular Disease

## 2023-10-13 ENCOUNTER — Encounter: Payer: Self-pay | Admitting: Cardiovascular Disease

## 2023-10-13 ENCOUNTER — Ambulatory Visit: Payer: PRIVATE HEALTH INSURANCE | Admitting: Cardiovascular Disease

## 2023-10-13 VITALS — BP 154/74 | HR 64 | Ht 64.5 in | Wt 187.6 lb

## 2023-10-13 DIAGNOSIS — R002 Palpitations: Secondary | ICD-10-CM | POA: Diagnosis not present

## 2023-10-13 DIAGNOSIS — I471 Supraventricular tachycardia, unspecified: Secondary | ICD-10-CM | POA: Insufficient documentation

## 2023-10-13 MED ORDER — METOPROLOL TARTRATE 25 MG PO TABS: 12.5000 mg | ORAL_TABLET | Freq: Two times a day (BID) | ORAL | 3 refills | Status: AC

## 2023-10-13 NOTE — Patient Instructions (Signed)
 Medication Instructions:  STOP Diltiazem   START Metoprolol  Tartrate 12.5 mg twice daily - no longer taking just as needed, please start taking twice daily on a scheduled basis *If you need a refill on your cardiac medications before your next appointment, please call your pharmacy*   Follow-Up: At [facility], you and your health needs are our priority.  As part of our continuing mission to provide you with exceptional heart care, our providers are all part of one team.  This team includes your primary Cardiologist (physician) and Advanced Practice Providers or APPs (Physician Assistants and Nurse Practitioners) who all work together to provide you with the care you need, when you need it.  Your next appointment:   6 month(s)  Provider:   Marlane Silver, MD

## 2023-10-13 NOTE — Progress Notes (Signed)
  Cardiology Office Note:    Date:  10/13/2023   ID:  Derk Fleming, DOB Feb 22, 1951, MRN [id number]  PCP:  Bufford Carne, FNP   [facility] Providers Cardiologist:  None Electrophysiologist:  Efraim Grange, MD     Referring MD: Bufford Carne, FNP   Chief complaint: fast heart rates  History of Present Illness:    Kendra Russell is a 73 y.o. female with a hx of asthma, hypothyroidism referred for arrhythmia management.  A loop recorder was placed for palpitations and detected a short RP SVT with a rate of about 180 bpm. EP study and ablation were recommended on several occasions, but she declined and opted for a conservative approach. The loop recorder was removed when it reached ERI.  Today she presents to discuss the EP study and ablation procedure given, yet she is not interested in scheduling it currently.  We scheduled her for ablation at her previous visit, but she canceled the procedure because several of her family members and friends had passed away.  She returns today to again discuss the procedure.  She notes that her mom recently passed away, and she is occupied with those arrangements and still not in a position to schedule the procedure.  Over the past few weeks, she has been taking the metoprolol  scheduled and feels that it has helped reduce her palpitation burden.  Prior to taking metoprolol  regularly, she noted that certain situations --heat, overeating--often triggered her events.   EKGs/Labs/Other Studies Reviewed:     TTE 2017 - LVEF 65-70%, normal wall thickness, normal wall motion, diastolic    dysfunction, indeterminate LV filling pressure, normal biatrial    size, normal IVC.   EKG:  Last EKG results: today - normal sinus rhythm   Recent Labs: No results found for requested labs within last 365 days.     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  We have now discussed the ablation procedure 2 times.  She is interested in scheduling in the future.  In the interim we will continue metoprolol  and have her take it scheduled 12.5 mg twice daily.  We will take diltiazem  off her medication list.          Medication Adjustments/Labs and Tests Ordered: Current medicines are reviewed at length with the patient today.  Concerns regarding medicines are outlined above.  Orders Placed This Encounter  Procedures   EKG 12-Lead   No orders of the defined types were placed in this encounter.    Signed, Efraim Grange, MD  10/13/2023 12:02 PM    [facility]

## 2023-10-19 DIAGNOSIS — M5416 Radiculopathy, lumbar region: Secondary | ICD-10-CM | POA: Diagnosis not present

## 2023-10-29 DIAGNOSIS — M5416 Radiculopathy, lumbar region: Secondary | ICD-10-CM | POA: Diagnosis not present

## 2023-11-02 DIAGNOSIS — M5416 Radiculopathy, lumbar region: Secondary | ICD-10-CM | POA: Diagnosis not present

## 2023-11-16 DIAGNOSIS — M5416 Radiculopathy, lumbar region: Secondary | ICD-10-CM | POA: Diagnosis not present

## 2023-11-30 DIAGNOSIS — M5416 Radiculopathy, lumbar region: Secondary | ICD-10-CM | POA: Diagnosis not present

## 2023-12-08 DIAGNOSIS — I471 Supraventricular tachycardia, unspecified: Secondary | ICD-10-CM | POA: Diagnosis not present

## 2023-12-08 DIAGNOSIS — H9201 Otalgia, right ear: Secondary | ICD-10-CM | POA: Diagnosis not present

## 2023-12-08 DIAGNOSIS — R5383 Other fatigue: Secondary | ICD-10-CM | POA: Diagnosis not present

## 2023-12-08 DIAGNOSIS — K30 Functional dyspepsia: Secondary | ICD-10-CM | POA: Diagnosis not present

## 2023-12-08 DIAGNOSIS — E039 Hypothyroidism, unspecified: Secondary | ICD-10-CM | POA: Diagnosis not present

## 2023-12-08 DIAGNOSIS — J302 Other seasonal allergic rhinitis: Secondary | ICD-10-CM | POA: Diagnosis not present

## 2023-12-08 DIAGNOSIS — I10 Essential (primary) hypertension: Secondary | ICD-10-CM | POA: Diagnosis not present

## 2023-12-08 DIAGNOSIS — L282 Other prurigo: Secondary | ICD-10-CM | POA: Diagnosis not present

## 2023-12-10 ENCOUNTER — Other Ambulatory Visit: Payer: Self-pay | Admitting: Orthopedic Surgery

## 2023-12-10 DIAGNOSIS — M5416 Radiculopathy, lumbar region: Secondary | ICD-10-CM

## 2023-12-10 DIAGNOSIS — K30 Functional dyspepsia: Secondary | ICD-10-CM | POA: Diagnosis not present

## 2023-12-10 DIAGNOSIS — R5383 Other fatigue: Secondary | ICD-10-CM | POA: Diagnosis not present

## 2023-12-10 DIAGNOSIS — E039 Hypothyroidism, unspecified: Secondary | ICD-10-CM | POA: Diagnosis not present

## 2023-12-10 DIAGNOSIS — I10 Essential (primary) hypertension: Secondary | ICD-10-CM | POA: Diagnosis not present

## 2023-12-24 ENCOUNTER — Ambulatory Visit
Admission: RE | Admit: 2023-12-24 | Discharge: 2023-12-24 | Disposition: A | Discharge status: Home / Self Care | Payer: PRIVATE HEALTH INSURANCE | Source: Ambulatory Visit | Attending: Orthopedic Surgery | Admitting: Orthopedic Surgery

## 2023-12-24 DIAGNOSIS — M5416 Radiculopathy, lumbar region: Secondary | ICD-10-CM

## 2024-01-05 DIAGNOSIS — M47816 Spondylosis without myelopathy or radiculopathy, lumbar region: Secondary | ICD-10-CM | POA: Diagnosis not present

## 2024-01-05 DIAGNOSIS — M5126 Other intervertebral disc displacement, lumbar region: Secondary | ICD-10-CM | POA: Diagnosis not present

## 2024-01-10 DIAGNOSIS — M545 Low back pain, unspecified: Secondary | ICD-10-CM | POA: Diagnosis not present

## 2024-01-17 DIAGNOSIS — M4726 Other spondylosis with radiculopathy, lumbar region: Secondary | ICD-10-CM | POA: Diagnosis not present

## 2024-01-28 DIAGNOSIS — M4726 Other spondylosis with radiculopathy, lumbar region: Secondary | ICD-10-CM | POA: Diagnosis not present

## 2024-01-31 DIAGNOSIS — M4726 Other spondylosis with radiculopathy, lumbar region: Secondary | ICD-10-CM | POA: Diagnosis not present

## 2024-02-03 DIAGNOSIS — M4726 Other spondylosis with radiculopathy, lumbar region: Secondary | ICD-10-CM | POA: Diagnosis not present

## 2024-02-08 DIAGNOSIS — M4726 Other spondylosis with radiculopathy, lumbar region: Secondary | ICD-10-CM | POA: Diagnosis not present

## 2024-02-10 DIAGNOSIS — M4726 Other spondylosis with radiculopathy, lumbar region: Secondary | ICD-10-CM | POA: Diagnosis not present

## 2024-02-14 DIAGNOSIS — M2011 Hallux valgus (acquired), right foot: Secondary | ICD-10-CM | POA: Diagnosis not present

## 2024-02-14 DIAGNOSIS — M2012 Hallux valgus (acquired), left foot: Secondary | ICD-10-CM | POA: Diagnosis not present

## 2024-02-14 DIAGNOSIS — M205X2 Other deformities of toe(s) (acquired), left foot: Secondary | ICD-10-CM | POA: Diagnosis not present

## 2024-02-14 DIAGNOSIS — L02611 Cutaneous abscess of right foot: Secondary | ICD-10-CM | POA: Diagnosis not present

## 2024-02-14 DIAGNOSIS — L565 Disseminated superficial actinic porokeratosis (DSAP): Secondary | ICD-10-CM | POA: Diagnosis not present

## 2024-02-14 DIAGNOSIS — M205X1 Other deformities of toe(s) (acquired), right foot: Secondary | ICD-10-CM | POA: Diagnosis not present

## 2024-02-14 DIAGNOSIS — Q828 Other specified congenital malformations of skin: Secondary | ICD-10-CM | POA: Diagnosis not present

## 2024-02-15 DIAGNOSIS — J4541 Moderate persistent asthma with (acute) exacerbation: Secondary | ICD-10-CM | POA: Diagnosis not present

## 2024-02-15 DIAGNOSIS — J40 Bronchitis, not specified as acute or chronic: Secondary | ICD-10-CM | POA: Diagnosis not present

## 2024-02-15 DIAGNOSIS — I10 Essential (primary) hypertension: Secondary | ICD-10-CM | POA: Diagnosis not present

## 2024-02-16 DIAGNOSIS — I700 Atherosclerosis of aorta: Secondary | ICD-10-CM | POA: Diagnosis not present

## 2024-02-16 DIAGNOSIS — I771 Stricture of artery: Secondary | ICD-10-CM | POA: Diagnosis not present

## 2024-02-16 DIAGNOSIS — J40 Bronchitis, not specified as acute or chronic: Secondary | ICD-10-CM | POA: Diagnosis not present

## 2024-02-17 DIAGNOSIS — M4726 Other spondylosis with radiculopathy, lumbar region: Secondary | ICD-10-CM | POA: Diagnosis not present

## 2024-02-22 DIAGNOSIS — M4726 Other spondylosis with radiculopathy, lumbar region: Secondary | ICD-10-CM | POA: Diagnosis not present

## 2024-02-23 DIAGNOSIS — I10 Essential (primary) hypertension: Secondary | ICD-10-CM | POA: Diagnosis not present

## 2024-02-23 DIAGNOSIS — L299 Pruritus, unspecified: Secondary | ICD-10-CM | POA: Diagnosis not present

## 2024-02-23 DIAGNOSIS — I471 Supraventricular tachycardia, unspecified: Secondary | ICD-10-CM | POA: Diagnosis not present

## 2024-02-23 DIAGNOSIS — E785 Hyperlipidemia, unspecified: Secondary | ICD-10-CM | POA: Diagnosis not present

## 2024-02-23 DIAGNOSIS — J069 Acute upper respiratory infection, unspecified: Secondary | ICD-10-CM | POA: Diagnosis not present

## 2024-02-23 DIAGNOSIS — I700 Atherosclerosis of aorta: Secondary | ICD-10-CM | POA: Diagnosis not present

## 2024-02-29 DIAGNOSIS — L03031 Cellulitis of right toe: Secondary | ICD-10-CM | POA: Diagnosis not present

## 2024-03-01 DIAGNOSIS — M4726 Other spondylosis with radiculopathy, lumbar region: Secondary | ICD-10-CM | POA: Diagnosis not present

## 2024-03-02 DIAGNOSIS — M25559 Pain in unspecified hip: Secondary | ICD-10-CM | POA: Diagnosis not present

## 2024-03-02 DIAGNOSIS — J4530 Mild persistent asthma, uncomplicated: Secondary | ICD-10-CM | POA: Diagnosis not present

## 2024-03-02 DIAGNOSIS — Z0000 Encounter for general adult medical examination without abnormal findings: Secondary | ICD-10-CM | POA: Diagnosis not present

## 2024-03-02 DIAGNOSIS — I471 Supraventricular tachycardia, unspecified: Secondary | ICD-10-CM | POA: Diagnosis not present

## 2024-03-02 DIAGNOSIS — B001 Herpesviral vesicular dermatitis: Secondary | ICD-10-CM | POA: Diagnosis not present

## 2024-03-02 DIAGNOSIS — G4700 Insomnia, unspecified: Secondary | ICD-10-CM | POA: Diagnosis not present

## 2024-03-02 DIAGNOSIS — E785 Hyperlipidemia, unspecified: Secondary | ICD-10-CM | POA: Diagnosis not present

## 2024-03-02 DIAGNOSIS — I10 Essential (primary) hypertension: Secondary | ICD-10-CM | POA: Diagnosis not present

## 2024-03-02 DIAGNOSIS — I781 Nevus, non-neoplastic: Secondary | ICD-10-CM | POA: Diagnosis not present

## 2024-03-02 DIAGNOSIS — R350 Frequency of micturition: Secondary | ICD-10-CM | POA: Diagnosis not present

## 2024-03-02 DIAGNOSIS — E039 Hypothyroidism, unspecified: Secondary | ICD-10-CM | POA: Diagnosis not present

## 2024-03-03 DIAGNOSIS — R350 Frequency of micturition: Secondary | ICD-10-CM | POA: Diagnosis not present

## 2024-03-07 DIAGNOSIS — M4726 Other spondylosis with radiculopathy, lumbar region: Secondary | ICD-10-CM | POA: Diagnosis not present

## 2024-03-09 DIAGNOSIS — M4726 Other spondylosis with radiculopathy, lumbar region: Secondary | ICD-10-CM | POA: Diagnosis not present

## 2024-03-10 ENCOUNTER — Other Ambulatory Visit (HOSPITAL_COMMUNITY): Payer: Self-pay | Admitting: Family Medicine

## 2024-03-10 DIAGNOSIS — E785 Hyperlipidemia, unspecified: Secondary | ICD-10-CM

## 2024-03-14 DIAGNOSIS — L03031 Cellulitis of right toe: Secondary | ICD-10-CM | POA: Diagnosis not present

## 2024-03-14 DIAGNOSIS — M4726 Other spondylosis with radiculopathy, lumbar region: Secondary | ICD-10-CM | POA: Diagnosis not present

## 2024-03-15 DIAGNOSIS — Z1211 Encounter for screening for malignant neoplasm of colon: Secondary | ICD-10-CM | POA: Diagnosis not present

## 2024-03-16 DIAGNOSIS — M4726 Other spondylosis with radiculopathy, lumbar region: Secondary | ICD-10-CM | POA: Diagnosis not present

## 2024-03-21 DIAGNOSIS — M4726 Other spondylosis with radiculopathy, lumbar region: Secondary | ICD-10-CM | POA: Diagnosis not present

## 2024-03-22 DIAGNOSIS — Z23 Encounter for immunization: Secondary | ICD-10-CM | POA: Diagnosis not present

## 2024-03-23 DIAGNOSIS — M4726 Other spondylosis with radiculopathy, lumbar region: Secondary | ICD-10-CM | POA: Diagnosis not present

## 2024-03-28 DIAGNOSIS — M4726 Other spondylosis with radiculopathy, lumbar region: Secondary | ICD-10-CM | POA: Diagnosis not present

## 2024-04-04 DIAGNOSIS — M4726 Other spondylosis with radiculopathy, lumbar region: Secondary | ICD-10-CM | POA: Diagnosis not present

## 2024-04-10 DIAGNOSIS — L282 Other prurigo: Secondary | ICD-10-CM | POA: Diagnosis not present

## 2024-04-10 DIAGNOSIS — J45901 Unspecified asthma with (acute) exacerbation: Secondary | ICD-10-CM | POA: Diagnosis not present

## 2024-04-18 DIAGNOSIS — M4726 Other spondylosis with radiculopathy, lumbar region: Secondary | ICD-10-CM | POA: Diagnosis not present
